# Patient Record
Sex: Male | Born: 1950 | Race: White | Hispanic: No | Marital: Married | Smoking: Never smoker
Health system: Southern US, Community
[De-identification: ages and names within clinical notes are randomized; demographics above are authoritative.]

## PROBLEM LIST (undated history)

## (undated) DIAGNOSIS — Z9119 Patient's noncompliance with other medical treatment and regimen: Secondary | ICD-10-CM

## (undated) DIAGNOSIS — G5620 Lesion of ulnar nerve, unspecified upper limb: Secondary | ICD-10-CM

## (undated) DIAGNOSIS — E669 Obesity, unspecified: Secondary | ICD-10-CM

## (undated) DIAGNOSIS — R7303 Prediabetes: Secondary | ICD-10-CM

## (undated) DIAGNOSIS — I482 Chronic atrial fibrillation, unspecified: Secondary | ICD-10-CM

## (undated) DIAGNOSIS — Z9049 Acquired absence of other specified parts of digestive tract: Secondary | ICD-10-CM

## (undated) DIAGNOSIS — I5022 Chronic systolic (congestive) heart failure: Secondary | ICD-10-CM

## (undated) DIAGNOSIS — I428 Other cardiomyopathies: Secondary | ICD-10-CM

## (undated) DIAGNOSIS — G4730 Sleep apnea, unspecified: Secondary | ICD-10-CM

## (undated) DIAGNOSIS — G4733 Obstructive sleep apnea (adult) (pediatric): Secondary | ICD-10-CM

## (undated) DIAGNOSIS — J449 Chronic obstructive pulmonary disease, unspecified: Secondary | ICD-10-CM

## (undated) DIAGNOSIS — I4819 Other persistent atrial fibrillation: Secondary | ICD-10-CM

## (undated) DIAGNOSIS — G5600 Carpal tunnel syndrome, unspecified upper limb: Secondary | ICD-10-CM

## (undated) DIAGNOSIS — R972 Elevated prostate specific antigen [PSA]: Secondary | ICD-10-CM

## (undated) DIAGNOSIS — I10 Essential (primary) hypertension: Secondary | ICD-10-CM

## (undated) DIAGNOSIS — I340 Nonrheumatic mitral (valve) insufficiency: Secondary | ICD-10-CM

## (undated) HISTORY — DX: Other persistent atrial fibrillation: I48.19

## (undated) HISTORY — DX: Patient's noncompliance with other medical treatment and regimen: Z91.19

## (undated) HISTORY — DX: Chronic systolic (congestive) heart failure: I50.22

## (undated) HISTORY — DX: Nonrheumatic mitral (valve) insufficiency: I34.0

## (undated) HISTORY — DX: Carpal tunnel syndrome, unspecified upper limb: G56.00

## (undated) HISTORY — DX: Obesity, unspecified: E66.9

## (undated) HISTORY — DX: Other cardiomyopathies: I42.8

## (undated) HISTORY — DX: Prediabetes: R73.03

## (undated) HISTORY — DX: Sleep apnea, unspecified: G47.30

## (undated) HISTORY — DX: Chronic atrial fibrillation, unspecified: I48.20

## (undated) HISTORY — DX: Elevated prostate specific antigen (PSA): R97.20

## (undated) HISTORY — DX: Lesion of ulnar nerve, unspecified upper limb: G56.20

## (undated) HISTORY — DX: Obstructive sleep apnea (adult) (pediatric): G47.33

## (undated) HISTORY — DX: Acquired absence of other specified parts of digestive tract: Z90.49

## (undated) HISTORY — DX: Essential (primary) hypertension: I10

## (undated) HISTORY — DX: Chronic obstructive pulmonary disease, unspecified: J44.9

---

## 2012-09-25 LAB — HM COLONOSCOPY

## 2012-10-30 DIAGNOSIS — G4730 Sleep apnea, unspecified: Secondary | ICD-10-CM | POA: Insufficient documentation

## 2012-10-30 DIAGNOSIS — K635 Polyp of colon: Secondary | ICD-10-CM | POA: Insufficient documentation

## 2013-02-15 DIAGNOSIS — Z9049 Acquired absence of other specified parts of digestive tract: Secondary | ICD-10-CM | POA: Insufficient documentation

## 2013-02-15 HISTORY — DX: Acquired absence of other specified parts of digestive tract: Z90.49

## 2013-02-15 HISTORY — PX: OTHER SURGICAL HISTORY: SHX169

## 2013-02-15 HISTORY — PX: APPENDECTOMY: SHX54

## 2013-10-29 DIAGNOSIS — R399 Unspecified symptoms and signs involving the genitourinary system: Secondary | ICD-10-CM | POA: Insufficient documentation

## 2013-11-18 DIAGNOSIS — K2970 Gastritis, unspecified, without bleeding: Secondary | ICD-10-CM | POA: Insufficient documentation

## 2014-01-13 DIAGNOSIS — J449 Chronic obstructive pulmonary disease, unspecified: Secondary | ICD-10-CM

## 2014-01-13 HISTORY — DX: Chronic obstructive pulmonary disease, unspecified: J44.9

## 2014-04-25 DIAGNOSIS — I10 Essential (primary) hypertension: Secondary | ICD-10-CM

## 2014-04-25 HISTORY — DX: Essential (primary) hypertension: I10

## 2014-05-10 DIAGNOSIS — G4733 Obstructive sleep apnea (adult) (pediatric): Secondary | ICD-10-CM

## 2014-05-10 HISTORY — DX: Obstructive sleep apnea (adult) (pediatric): G47.33

## 2014-05-10 LAB — PSA: PSA: 4.6

## 2015-02-15 DIAGNOSIS — E669 Obesity, unspecified: Secondary | ICD-10-CM | POA: Insufficient documentation

## 2015-02-15 HISTORY — DX: Obesity, unspecified: E66.9

## 2015-02-15 LAB — PSA: PSA: 4.9

## 2015-02-17 LAB — PSA: PSA: 4.9

## 2015-02-17 LAB — HEMOGLOBIN A1C: HEMOGLOBIN A1C: 5.7

## 2015-04-05 DIAGNOSIS — R972 Elevated prostate specific antigen [PSA]: Secondary | ICD-10-CM | POA: Insufficient documentation

## 2015-09-21 ENCOUNTER — Ambulatory Visit (INDEPENDENT_AMBULATORY_CARE_PROVIDER_SITE_OTHER): Payer: BLUE CROSS/BLUE SHIELD

## 2015-09-21 ENCOUNTER — Ambulatory Visit (INDEPENDENT_AMBULATORY_CARE_PROVIDER_SITE_OTHER): Payer: BLUE CROSS/BLUE SHIELD | Admitting: Family Medicine

## 2015-09-21 ENCOUNTER — Encounter: Payer: Self-pay | Admitting: Family Medicine

## 2015-09-21 VITALS — BP 135/75 | HR 80 | Ht 72.0 in | Wt 225.0 lb

## 2015-09-21 DIAGNOSIS — Z1159 Encounter for screening for other viral diseases: Secondary | ICD-10-CM

## 2015-09-21 DIAGNOSIS — Z114 Encounter for screening for human immunodeficiency virus [HIV]: Secondary | ICD-10-CM

## 2015-09-21 DIAGNOSIS — G5623 Lesion of ulnar nerve, bilateral upper limbs: Secondary | ICD-10-CM

## 2015-09-21 DIAGNOSIS — M25561 Pain in right knee: Secondary | ICD-10-CM | POA: Diagnosis not present

## 2015-09-21 DIAGNOSIS — R972 Elevated prostate specific antigen [PSA]: Secondary | ICD-10-CM

## 2015-09-21 DIAGNOSIS — M170 Bilateral primary osteoarthritis of knee: Secondary | ICD-10-CM | POA: Diagnosis not present

## 2015-09-21 DIAGNOSIS — G5603 Carpal tunnel syndrome, bilateral upper limbs: Secondary | ICD-10-CM

## 2015-09-21 DIAGNOSIS — E669 Obesity, unspecified: Secondary | ICD-10-CM

## 2015-09-21 DIAGNOSIS — G5600 Carpal tunnel syndrome, unspecified upper limb: Secondary | ICD-10-CM | POA: Insufficient documentation

## 2015-09-21 DIAGNOSIS — G5620 Lesion of ulnar nerve, unspecified upper limb: Secondary | ICD-10-CM | POA: Insufficient documentation

## 2015-09-21 HISTORY — DX: Elevated prostate specific antigen (PSA): R97.20

## 2015-09-21 MED ORDER — DICLOFENAC SODIUM 1 % TD GEL: 2.0000 g | Freq: Four times a day (QID) | TRANSDERMAL | 11 refills | Status: DC

## 2015-09-21 NOTE — Patient Instructions (Signed)
Thank you for coming in today. Get labs soon.  Call or go to the ER if you develop a large red swollen joint with extreme pain or oozing puss.   Arthritis Arthritis is a term that is commonly used to refer to joint pain or joint disease. There are more than 100 types of arthritis. CAUSES The most common cause of this condition is wear and tear of a joint. Other causes include:  Gout.  Inflammation of a joint.  An infection of a joint.  Sprains and other injuries near the joint.  A drug reaction or allergic reaction. In some cases, the cause may not be known. SYMPTOMS The main symptom of this condition is pain in the joint with movement. Other symptoms include:  Redness, swelling, or stiffness at a joint.  Warmth coming from the joint.  Fever.  Overall feeling of illness. DIAGNOSIS This condition may be diagnosed with a physical exam and tests, including:  Blood tests.  Urine tests.  Imaging tests, such as MRI, X-rays, or a CT scan. Sometimes, fluid is removed from a joint for testing. TREATMENT Treatment for this condition may involve:  Treatment of the cause, if it is known.  Rest.  Raising (elevating) the joint.  Applying cold or hot packs to the joint.  Medicines to improve symptoms and reduce inflammation.  Injections of a steroid such as cortisone into the joint to help reduce pain and inflammation. Depending on the cause of your arthritis, you may need to make lifestyle changes to reduce stress on your joint. These changes may include exercising more and losing weight. HOME CARE INSTRUCTIONS Medicines  Take over-the-counter and prescription medicines only as told by your health care provider.  Do not take aspirin to relieve pain if gout is suspected. Activities  Rest your joint if told by your health care provider. Rest is important when your disease is active and your joint feels painful, swollen, or stiff.  Avoid activities that make the pain  worse. It is important to balance activity with rest.  Exercise your joint regularly with range-of-motion exercises as told by your health care provider. Try doing low-impact exercise, such as:  Swimming.  Water aerobics.  Biking.  Walking. Joint Care  If your joint is swollen, keep it elevated if told by your health care provider.  If your joint feels stiff in the morning, try taking a warm shower.  If directed, apply heat to the joint. If you have diabetes, do not apply heat without permission from your health care provider.  Put a towel between the joint and the hot pack or heating pad.  Leave the heat on the area for 20-30 minutes.  If directed, apply ice to the joint:  Put ice in a plastic bag.  Place a towel between your skin and the bag.  Leave the ice on for 20 minutes, 2-3 times per day.  Keep all follow-up visits as told by your health care provider. This is important. SEEK MEDICAL CARE IF:  The pain gets worse.  You have a fever. SEEK IMMEDIATE MEDICAL CARE IF:  You develop severe joint pain, swelling, or redness.  Many joints become painful and swollen.  You develop severe back pain.  You develop severe weakness in your leg.  You cannot control your bladder or bowels.   This information is not intended to replace advice given to you by your health care provider. Make sure you discuss any questions you have with your health care provider.  Document Released: 03/21/2004 Document Revised: 11/02/2014 Document Reviewed: 05/09/2014 Elsevier Interactive Patient Education [employer].

## 2015-09-21 NOTE — Progress Notes (Signed)
Christian Ortiz is a 65 y.o. male who presents to [facility] today to establish care.  Patient complaints of ongoing anterior right knee pain for years.  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  Denies known injury.  He has tried ibuprofen and a sleeve which alleviates the pain.   Denies swelling, locking, catching, and weakness of his right leg.  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  Patient also endorses mild urinary hesitancy and incomplete emptying.  Denies dysuria and frequency.  He as a history of an elevated PSA to 4.9.  His symptoms have improved over the past year because he lost over 75 pounds.     AUA score 3/35 Quality of life score 1/6  Patient also has a history of pre-diabetes, hypertension, COPD, and OSA which have resolved since he lost weight.  He isn't taking any current medications.   Patient is followed by an orthopedist at [facility] for bilateral carpal tunnel and ulnar neuropathy.  He declined surgery.  His symptoms have improved since losing weight.   Health maintenance: Colon cancer screening up to date.    Past Medical History:  Diagnosis Date  . Carpal tunnel syndrome    Bilateral  . Ulnar neuropathy    No past surgical history on file. Social History  Substance Use Topics  . Smoking status: Never Smoker  . Smokeless tobacco: Never Used  . Alcohol use No   family history includes Cancer in his mother; Hypertension in his father.  ROS as above: No headache, visual changes, nausea, vomiting, diarrhea, constipation, dizziness, abdominal pain, skin rash, fevers, chills, night sweats, weight loss, swollen lymph nodes, body aches, joint swelling, muscle aches, chest pain, shortness of breath, mood changes, visual or auditory  hallucinations.   Medications: Current Outpatient Prescriptions  Medication Sig Dispense Refill  . diclofenac sodium (VOLTAREN) 1 % GEL Apply 2 g topically 4 (four) times daily. To affected joint. 100 g 11   No current facility-administered medications for this visit.    No Known Allergies   Exam:  BP 135/75   Pulse 80   Ht 6' (1.829 m)   Wt 225 lb (102.1 kg)   BMI 30.52 kg/m  Gen: Well NAD HEENT: EOMI,  MMM Lungs: Normal work of breathing. CTABL Heart: RRR no MRG Abd: NABS, Soft. Nondistended, Nontender Exts: Brisk capillary refill, warm and well perfused.  Right knee: No obvious deformity Full range of motion with mild crepitation in bilateral knees No bony tenderness. Strength, sensation to light touch, and reflexes intact  Procedure: Real-time Ultrasound Guided Injection of right knee  Device: GE Logiq E  Images permanently stored and available for review in the ultrasound unit. Verbal informed consent obtained. Discussed risks and benefits of procedure. Warned about infection bleeding damage to structures skin hypopigmentation and fat atrophy among others. Patient expresses understanding and agreement Time-out conducted.  Noted no overlying erythema, induration, or other signs of local infection.  Skin prepped in a sterile fashion.  Local anesthesia: Topical Ethyl chloride.  With sterile technique and under real time ultrasound guidance: 80mg  kenalog and 27ml marcaine injected easily.  Lot number for Marcaine: [id number] Lot number Kenalog [id number] Completed without difficulty  Pain immediately resolved suggesting accurate placement of the medication.  Advised to call if  fevers/chills, erythema, induration, drainage, or persistent bleeding.  Images permanently stored and available for review in the ultrasound unit.  Impression: Technically successful ultrasound guided injection.    No results found for this or any previous visit (from the past 24  hour(s)). No results found.  Knee Xray: Medial joint space narrowing of the right knee with osteophytes consistent with right knee osteoarthritis. Awaiting formal radiology review  Assessment and Plan: 65 y.o. male with:  1. Chronic right knee pain with evidence of osteophytes and joint space narrowing on Xray consistent with osteoarthritis   - Triamcinolone injection   - Voltaren gel  2. Elevated PSA: urinary symptoms mild and improving with weight loss.  Not taking any medications for BPH.   - Recheck PSA  3. Bilateral carpal tunnel syndrome and bilateral ulnar neuropathy   - Patient will send records from his EMG study   - may consider injections if symptoms worsen  4. Health maintenance:   - Hepatitis C and HIV screening   - CBC, CMP, TSH, A1c, lipid panel  Orders Placed This Encounter  Procedures  . DG Knee Complete 4 Views Right    Please include patellar sunrise, lateral, and weightbearing bilateral AP and bilateral rosenberg views    Order Specific Question:   Reason for exam:    Answer:   Please include patellar sunrise, lateral, and weightbearing bilateral AP and bilateral rosenberg views    Comments:   Please include patellar sunrise, lateral, and weightbearing bilateral AP and bilateral rosenberg views    Order Specific Question:   Preferred imaging location?    Answer:   [facility]  . DG Knee 1-2 Views Left    Order Specific Question:   Reason for Exam (SYMPTOM  OR DIAGNOSIS REQUIRED)    Answer:   For use with right knee x-ray, bilateral AP and Rosenberg standing.    Order Specific Question:   Preferred imaging location?    Answer:   [facility]  . CBC  . Comprehensive metabolic panel    Order Specific Question:   Has the patient fasted?    Answer:   No  . Hemoglobin A1c  . Lipid panel    Order Specific Question:   Has the patient fasted?    Answer:   No  . VITAMIN D 25 Hydroxy (Vit-D Deficiency, Fractures)  . TSH  . Hepatitis C  antibody  . HIV antibody  . PSA    Discussed warning signs or symptoms. Please see discharge instructions. Patient expresses understanding.

## 2015-09-26 ENCOUNTER — Encounter: Payer: Self-pay | Admitting: Family Medicine

## 2015-09-26 ENCOUNTER — Ambulatory Visit (INDEPENDENT_AMBULATORY_CARE_PROVIDER_SITE_OTHER): Payer: BLUE CROSS/BLUE SHIELD | Admitting: Family Medicine

## 2015-09-26 VITALS — BP 139/86 | HR 57 | Wt 223.0 lb

## 2015-09-26 DIAGNOSIS — J410 Simple chronic bronchitis: Secondary | ICD-10-CM | POA: Diagnosis not present

## 2015-09-26 DIAGNOSIS — Z1159 Encounter for screening for other viral diseases: Secondary | ICD-10-CM

## 2015-09-26 DIAGNOSIS — IMO0001 Reserved for inherently not codable concepts without codable children: Secondary | ICD-10-CM

## 2015-09-26 DIAGNOSIS — R972 Elevated prostate specific antigen [PSA]: Secondary | ICD-10-CM

## 2015-09-26 DIAGNOSIS — Z114 Encounter for screening for human immunodeficiency virus [HIV]: Secondary | ICD-10-CM

## 2015-09-26 DIAGNOSIS — K635 Polyp of colon: Secondary | ICD-10-CM

## 2015-09-26 DIAGNOSIS — Z0000 Encounter for general adult medical examination without abnormal findings: Secondary | ICD-10-CM | POA: Diagnosis not present

## 2015-09-26 LAB — LIPID PANEL
CHOL/HDL RATIO: 2.3 ratio (ref <=5.0)
CHOLESTEROL: 186 mg/dL (ref 125–200)
HDL: 80 mg/dL (ref >=40)
LDL Cholesterol: 96 mg/dL (ref <130)
TRIGLYCERIDES: 50 mg/dL (ref <150)
VLDL: 10 mg/dL (ref <30)

## 2015-09-26 LAB — COMPREHENSIVE METABOLIC PANEL
ALBUMIN: 4.3 g/dL (ref 3.6–5.1)
ALK PHOS: 66 U/L (ref 40–115)
ALT: 19 U/L (ref 9–46)
AST: 21 U/L (ref 10–35)
BUN: 16 mg/dL (ref 7–25)
CALCIUM: 9.6 mg/dL (ref 8.6–10.3)
CHLORIDE: 106 mmol/L (ref 98–110)
CO2: 23 mmol/L (ref 20–31)
Creat: 0.83 mg/dL (ref 0.70–1.25)
Glucose, Bld: 89 mg/dL (ref 65–99)
POTASSIUM: 4.5 mmol/L (ref 3.5–5.3)
Sodium: 141 mmol/L (ref 135–146)
TOTAL PROTEIN: 7.2 g/dL (ref 6.1–8.1)
Total Bilirubin: 0.6 mg/dL (ref 0.2–1.2)

## 2015-09-26 LAB — CBC
HEMATOCRIT: 43.4 % (ref 38.5–50.0)
HEMOGLOBIN: 14.4 g/dL (ref 13.2–17.1)
MCH: 30.9 pg (ref 27.0–33.0)
MCHC: 33.2 g/dL (ref 32.0–36.0)
MCV: 93.1 fL (ref 80.0–100.0)
MPV: 10.5 fL (ref 7.5–12.5)
Platelets: 277 10*3/uL (ref 140–400)
RBC: 4.66 MIL/uL (ref 4.20–5.80)
RDW: 14 % (ref 11.0–15.0)
WBC: 11 10*3/uL — ABNORMAL HIGH (ref 3.8–10.8)

## 2015-09-26 LAB — HEMOGLOBIN A1C
Hgb A1c MFr Bld: 5.8 % — ABNORMAL HIGH (ref <5.7)
MEAN PLASMA GLUCOSE: 120 mg/dL

## 2015-09-26 NOTE — Progress Notes (Signed)
       Ferrin Creach is a 65 y.o. male who presents to [facility] today for a visit. Patient was recently seen to establish care for problem focused visit. He would like to discuss wellness items today. He's doing well with no active complaints. He notes he recently has up-to-date colonoscopy and vaccines. He has not yet obtain fasting labs. He feels well with no fevers or chills nausea vomiting or diarrhea. He tries to exercise daily and reduces food intake.  We have not yet received medical records.   Past Medical History:  Diagnosis Date  . Carpal tunnel syndrome    Bilateral  . Ulnar neuropathy    Past Surgical History:  Procedure Laterality Date  . APPENDECTOMY     Social History  Substance Use Topics  . Smoking status: Never Smoker  . Smokeless tobacco: Never Used  . Alcohol use No   family history includes COPD in his father; Cancer in his mother; Diabetes in his father; Hypertension in his father.  ROS as above: No headache, visual changes, nausea, vomiting, diarrhea, constipation, dizziness, abdominal pain, skin rash, fevers, chills, night sweats, weight loss, swollen lymph nodes, body aches, joint swelling, muscle aches, chest pain, shortness of breath, mood changes, visual or auditory hallucinations.    Medications: Current Outpatient Prescriptions  Medication Sig Dispense Refill  . diclofenac sodium (VOLTAREN) 1 % GEL Apply 2 g topically 4 (four) times daily. To affected joint. 100 g 11   No current facility-administered medications for this visit.    No Known Allergies   Exam:  BP 139/86   Pulse (!) 57   Wt 223 lb (101.2 kg)   BMI 30.24 kg/m  Gen: Well NAD HEENT: EOMI,  MMM Lungs: Normal work of breathing. CTABL Heart: RRR no MRG Abd: NABS, Soft. Nondistended, Nontender Exts: Brisk capillary refill, warm and well perfused.   No  results found for this or any previous visit (from the past 24 hour(s)). No results found.    Assessment and Plan: 65 y.o. male with Well adult. Obtain basic fasting labs. Obtain medical records to check on health maintenance items. Based on patient report they'll seem to be up-to-date. Continue activities and fitness.   Orders Placed This Encounter  Procedures  . CBC  . Comprehensive metabolic panel    Order Specific Question:   Has the patient fasted?    Answer:   No  . Lipid panel    Order Specific Question:   Has the patient fasted?    Answer:   No  . Hemoglobin A1c  . VITAMIN D 25 Hydroxy (Vit-D Deficiency, Fractures)  . PSA  . HIV antibody  . Hepatitis C antibody    Discussed warning signs or symptoms. Please see discharge instructions. Patient expresses understanding.

## 2015-09-26 NOTE — Patient Instructions (Signed)
Thank you for coming in today. Get fasting labs.  Return as needed.  Call or go to the emergency room if you get worse, have trouble breathing, have chest pains, or palpitations.

## 2015-09-27 ENCOUNTER — Encounter: Payer: Self-pay | Admitting: Family Medicine

## 2015-09-27 DIAGNOSIS — R7303 Prediabetes: Secondary | ICD-10-CM

## 2015-09-27 HISTORY — DX: Prediabetes: R73.03

## 2015-09-27 LAB — PSA: PSA: 4.42 ng/mL — ABNORMAL HIGH (ref <=4.00)

## 2015-09-27 LAB — HEPATITIS C ANTIBODY: HCV AB: NEGATIVE

## 2015-09-27 LAB — HIV ANTIBODY (ROUTINE TESTING W REFLEX): HIV: NONREACTIVE

## 2015-09-27 LAB — VITAMIN D 25 HYDROXY (VIT D DEFICIENCY, FRACTURES): VIT D 25 HYDROXY: 37 ng/mL (ref 30–100)

## 2015-10-18 ENCOUNTER — Encounter: Payer: Self-pay | Admitting: Family Medicine

## 2015-10-19 ENCOUNTER — Encounter: Payer: Self-pay | Admitting: Family Medicine

## 2016-01-29 ENCOUNTER — Ambulatory Visit (INDEPENDENT_AMBULATORY_CARE_PROVIDER_SITE_OTHER): Payer: BLUE CROSS/BLUE SHIELD | Admitting: Family Medicine

## 2016-01-29 ENCOUNTER — Encounter: Payer: Self-pay | Admitting: Family Medicine

## 2016-01-29 ENCOUNTER — Other Ambulatory Visit: Payer: Self-pay | Admitting: Family Medicine

## 2016-01-29 ENCOUNTER — Ambulatory Visit (INDEPENDENT_AMBULATORY_CARE_PROVIDER_SITE_OTHER): Payer: Worker's Compensation

## 2016-01-29 VITALS — BP 142/75 | HR 62 | Wt 230.0 lb

## 2016-01-29 DIAGNOSIS — M25561 Pain in right knee: Secondary | ICD-10-CM

## 2016-01-29 DIAGNOSIS — Z23 Encounter for immunization: Secondary | ICD-10-CM

## 2016-01-29 DIAGNOSIS — M170 Bilateral primary osteoarthritis of knee: Secondary | ICD-10-CM | POA: Diagnosis not present

## 2016-01-29 NOTE — Patient Instructions (Signed)
Thank you for coming in today. Take it easy.  Use the gel and take ibuprofen or aleve for pain as needed.  Return if worse.

## 2016-01-29 NOTE — Progress Notes (Signed)
   Christian Ortiz is a 65 y.o. male who presents to [facility] today for Knee pain.  Patient was in his normal state of health today. He was at work today when he stepped on a package and twisted his knee. He fell to the floor. He notes pain in a small amount of swelling. He has a history of pre-existing DJD to the right knee however he was essentially pain-free prior to this injury today. He denies significant locking or catching. He denies any fevers or chills. He has not tried treatment.    Past Medical History:  Diagnosis Date  . Carpal tunnel syndrome    Bilateral  . Ulnar neuropathy    Past Surgical History:  Procedure Laterality Date  . APPENDECTOMY     Social History  Substance Use Topics  . Smoking status: Never Smoker  . Smokeless tobacco: Never Used  . Alcohol use No     ROS:  As above   Medications: Current Outpatient Prescriptions  Medication Sig Dispense Refill  . diclofenac sodium (VOLTAREN) 1 % GEL Apply 2 g topically 4 (four) times daily. To affected joint. 100 g 11   No current facility-administered medications for this visit.    No Known Allergies   Exam:  BP (!) 142/75   Pulse 62   Wt 230 lb (104.3 kg)   BMI 31.19 kg/m  General: Well Developed, well nourished, and in no acute distress.  Neuro/Psych: Alert and oriented x3, extra-ocular muscles intact, able to move all 4 extremities, sensation grossly intact. Skin: Warm and dry, no rashes noted.  Respiratory: Not using accessory muscles, speaking in full sentences, trachea midline.  Cardiovascular: Pulses palpable, no extremity edema. Abdomen: Does not appear distended. MSK: Right knee no significant effusion. Range of motion 0-120. Tender palpation medial joint line. Negative anterior posterior drawer testing. Mildly positive medial McMurray's test. Negative laxity or significant pain with valgus or varus stress. Antalgic gait present.  X-ray  right knee shows significant medial compartment DJD. No acute fractures visible. Awaiting formal radiology review     No results found for this or any previous visit (from the past 48 hour(s)). No results found.    Assessment and Plan: 65 y.o. male with knee pain. Injury at work. Patient likely has exacerbation of existing DJD. Plan for diclofenac gel and relative rest. If not improving proceed with injection versus MRI. Return as needed. Work note provided.  Of note patient is not yet filed for [employer]   Orders Placed This Encounter  Procedures  . DG Knee Complete 4 Views Right    Please include patellar sunrise, lateral, and weightbearing bilateral AP and bilateral rosenberg views    Standing Status:   Future    Number of Occurrences:   1    Standing Expiration Date:   03/30/2017    Order Specific Question:   Reason for exam:    Answer:   Please include patellar sunrise, lateral, and weightbearing bilateral AP and bilateral rosenberg views    Comments:   Please include patellar sunrise, lateral, and weightbearing bilateral AP and bilateral rosenberg views    Order Specific Question:   Preferred imaging location?    Answer:   [facility]    Discussed warning signs or symptoms. Please see discharge instructions. Patient expresses understanding.

## 2016-03-06 ENCOUNTER — Ambulatory Visit: Payer: Self-pay | Admitting: Family Medicine

## 2016-03-07 ENCOUNTER — Ambulatory Visit (INDEPENDENT_AMBULATORY_CARE_PROVIDER_SITE_OTHER): Payer: Worker's Compensation | Admitting: Family Medicine

## 2016-03-07 ENCOUNTER — Encounter: Payer: Self-pay | Admitting: Family Medicine

## 2016-03-07 VITALS — BP 139/75 | HR 78 | Wt 228.0 lb

## 2016-03-07 DIAGNOSIS — M25561 Pain in right knee: Secondary | ICD-10-CM | POA: Diagnosis not present

## 2016-03-07 NOTE — Progress Notes (Signed)
Christian Ortiz is a 66 y.o. male who presents to [facility] today for follow up right knee pain.   He was seen 1 month ago for worsening of right knee pain after work injury. Since then, right anterior knee pain has improved with bracing, tylenol, and voltaren gel. For the past couple of weeks, he has noted right posterior knee pain that is elicited by going up and down stairs. It also hurts after sitting with his knee bent for a long time and is relieved by movement. No mechanical catching, fevers, or joint swelling. No recent trauma to the area. He received a steroid shot in his knee 6 months ago.    Past Medical History:  Diagnosis Date  . Carpal tunnel syndrome    Bilateral  . Ulnar neuropathy    Past Surgical History:  Procedure Laterality Date  . APPENDECTOMY     Social History  Substance Use Topics  . Smoking status: Never Smoker  . Smokeless tobacco: Never Used  . Alcohol use No     ROS:  As above   Medications: Current Outpatient Prescriptions  Medication Sig Dispense Refill  . diclofenac sodium (VOLTAREN) 1 % GEL Apply 2 g topically 4 (four) times daily. To affected joint. 100 g 11   No current facility-administered medications for this visit.    No Known Allergies   Exam:  BP 139/75   Pulse 78   Wt 228 lb (103.4 kg)   BMI 30.92 kg/m  General: Well Developed, well nourished, and in no acute distress.  Neuro/Psych: Alert and oriented x3, extra-ocular muscles intact, able to move all 4 extremities, sensation grossly intact. Skin: Warm and dry, no rashes noted.  Respiratory: Not using accessory muscles, speaking in full sentences, trachea midline.  Cardiovascular: Pulses palpable, no extremity edema. Abdomen: Does not appear distended. MSK: Right knee normal appearing. No tenderness to palpation anteriorly. Tender to palpation posteriorly; no posterior masses. No pain with resisted flexion or extension. Varus  gait.   Right knee x-ray 01/29/16: IMPRESSION: Tricompartment degenerative changes without fracture or dislocation noted.  Procedure: Real-time Ultrasound Guided Injection of right knee  Device: GE Logiq E  Images permanently stored and available for review in the ultrasound unit. Verbal informed consent obtained. Discussed risks and benefits of procedure. Warned about infection bleeding damage to structures skin hypopigmentation and fat atrophy among others. Patient expresses understanding and agreement Time-out conducted.  Noted no overlying erythema, induration, or other signs of local infection.  Skin prepped in a sterile fashion.  Local anesthesia: Topical Ethyl chloride.  With sterile technique and under real time ultrasound guidance: 80mg  kenalog and 41ml marcaine injected easily.  Completed without difficulty  Pain immediately resolved suggesting accurate placement of the medication.  Advised to call if fevers/chills, erythema, induration, drainage, or persistent bleeding.  Images permanently stored and available for review in the ultrasound unit.  Impression: Technically successful ultrasound guided injection.    No results found for this or any previous visit (from the past 48 hour(s)). No results found.  Assessment and Plan: 66 y.o. male with right knee pain exacerbated by work injury 1 month ago. His pain has changed location but is still consistent with DJD. Pain persists despite diclofenac gel. - Steroid injection today - Physical therapy, exercise bike - F/u as needed; consider hyaluronic acid injection if not improved    No orders of the defined types were placed in this encounter.   Discussed warning signs or  symptoms. Please see discharge instructions. Patient expresses understanding.

## 2016-03-07 NOTE — Patient Instructions (Signed)
Thank you for coming in today. Call or go to the ER if you develop a large red swollen joint with extreme pain or oozing puss.  Return as needed.  Let me know if you are not better and we will try the gel shot.

## 2016-03-12 ENCOUNTER — Ambulatory Visit (INDEPENDENT_AMBULATORY_CARE_PROVIDER_SITE_OTHER): Payer: BLUE CROSS/BLUE SHIELD | Admitting: Physical Therapy

## 2016-03-12 ENCOUNTER — Encounter: Payer: Self-pay | Admitting: Physical Therapy

## 2016-03-12 DIAGNOSIS — M25661 Stiffness of right knee, not elsewhere classified: Secondary | ICD-10-CM | POA: Diagnosis not present

## 2016-03-12 DIAGNOSIS — M25561 Pain in right knee: Secondary | ICD-10-CM

## 2016-03-12 DIAGNOSIS — M6281 Muscle weakness (generalized): Secondary | ICD-10-CM

## 2016-03-12 NOTE — Therapy (Signed)
[place], [place], [address] Phone: [phone]   Fax:  [phone]  Physical Therapy Evaluation  Patient Details  Name: Christian Ortiz MRN: [id number] Date of Birth: December 11, 1950 Referring Provider: Dr Georgina Snell  Encounter Date: 03/12/2016      PT End of Session - 03/12/16 1105    Visit Number 1   Number of Visits 8   Date for PT Re-Evaluation 04/09/16   PT Start Time 1105   PT Stop Time 1147   PT Time Calculation (min) 42 min      Past Medical History:  Diagnosis Date  . Carpal tunnel syndrome    Bilateral  . Ulnar neuropathy     Past Surgical History:  Procedure Laterality Date  . APPENDECTOMY      There were no vitals filed for this visit.       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 No visible edema, immediate pain.  Saw MD after work. Was told to use the spin bike at home. . Had an injection and it has helped   How long can you sit comfortably? no limitations   How long can you walk comfortably? moves a lot and is doing ok.    Diagnostic tests x-rays show arthritis   Patient Stated Goals make the legs stronger.    Currently in Pain? No/denies            [facility] PT Assessment - 03/12/16 0001      Assessment   Medical Diagnosis Rt knee pain   Referring Provider Dr Georgina Snell   Onset Date/Surgical Date 01/30/16   Hand Dominance --  both   Next MD Visit 04/18/16   Prior Therapy none     Precautions   Precautions None   Required Braces or Orthoses --  wears knee support when at work, with brace on the side.      Balance Screen   Has the patient fallen in the past 6 months Yes   How many times? 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      Posture/Postural Control   Posture/Postural Control Postural limitations   Postural Limitations --  Rt knee varus      ROM / Strength   AROM / PROM / Strength AROM;Strength     AROM   AROM Assessment Site Knee   Right/Left Knee Left;Right   Right Knee Extension -2   Right Knee Flexion 132   Left Knee Extension 0   Left Knee Flexion 135     Strength   Strength Assessment Site Hip;Knee;Ankle   Right/Left Hip Right;Left   Right Hip Flexion --  5-/5  Right Hip Extension 4+/5   Right Hip ABduction 4/5   Left Hip Flexion 5/5   Left Hip Extension 4+/5   Left Hip ABduction 4+/5     Flexibility   Soft Tissue Assessment /Muscle Length yes   Hamstrings supine SLR Rt 90, Lt 96   Quadriceps prone knee flex Rt 109, Lt 127     Palpation   Patella mobility crepitis with movement   Palpation comment no point tenderness                   [facility] Adult PT Treatment/Exercise - 03/12/16 0001      Exercises   Exercises Knee/Hip     Knee/Hip Exercises: Stretches   Quad Stretch Right;2 reps;60 seconds  prone with strap     Knee/Hip Exercises: Standing   SLS 10 reps FWD leans Rt     Knee/Hip Exercises: Supine   Quad Sets Right;Strengthening;5 sets  10 sec holds   Straight Leg Raises Strengthening;Right;10 reps  in long sit     Knee/Hip Exercises: Sidelying   Hip ABduction Strengthening;Right;10 reps                     PT Long Term Goals - 03/12/16 1247      PT LONG TERM GOAL #1   Title I with advanced HEP (  04/09/16)    Time 4   Period Weeks   Status New     PT LONG TERM GOAL #2   Title increase Rt knee quad flexibility =/> 120 degrees in prone ( 04/09/16)_    Time 4   Period Weeks   Status New     PT LONG TERM GOAL #3   Title improve FOTO =/< 33% limited ( 04/09/16)    Time 4   Period Weeks   Status New     PT LONG TERM GOAL #4   Title demo Rt hip strength =/> 5-/5 ( 04/09/16)    Time 4   Period Weeks   Status New     PT LONG TERM GOAL #5   Title report =/> 75% improvement in Rt knee pain when transitioning sit to stand after prolonged sitting ( 04/09/16)    Time 4   Period Weeks   Status New               Plan - 03/12/16 1252    Clinical Impression Statement 66 yo male presents with Rt knee pain after a fall, xrays show arthritis in the joint. He has stiffness and pain in the knee, some better after the injection.  He has weakness in his hips and postural changes - varus in his Rt LE    Rehab Potential Excellent   PT Frequency 2x / week   PT Duration 4 weeks   PT Treatment/Interventions Moist Heat;Ultrasound;Therapeutic exercise;Dry needling;Taping;Manual techniques;Vasopneumatic Device;Neuromuscular re-education;Cryotherapy;Electrical Stimulation;Patient/family education   PT Next Visit Plan progress LE strengthening, proprioception and modalities PRN   Consulted and Agree with Plan of Care Patient      Patient will benefit from skilled therapeutic intervention in order to improve the following deficits and impairments:  Pain, Decreased range of motion, Decreased strength  Visit Diagnosis: Acute pain of right knee - Plan: PT plan of care cert/re-cert  Muscle weakness (generalized) - Plan: PT plan of care cert/re-cert  Stiffness of right knee, not elsewhere classified - Plan: PT plan of care cert/re-cert     Problem List Patient Active Problem List  Diagnosis Date Noted  . Prediabetes 09/27/2015  . Right knee pain 09/21/2015  . Carpal tunnel syndrome  09/21/2015  . Ulnar neuropathy 09/21/2015  . Elevated PSA 09/21/2015  . Obesity (BMI 30.0-34.9) 02/15/2015  . Chronic obstructive pulmonary disease ([place]) 01/13/2014  . Gastritis 11/18/2013  . Lower urinary tract symptoms (LUTS) 10/29/2013  . S/P laparoscopic appendectomy 02/15/2013  . Sessile colonic polyp 02/15/2013    Jeral Pinch PT  03/12/2016, 12:57 PM  [facility] [place], [place], [address] Phone: [phone]   Fax:  [phone]  Name: Mona Thommen MRN: [id number] Date of Birth: Sep 09, 1950

## 2016-03-12 NOTE — Patient Instructions (Addendum)
Strengthening: Straight Leg Raise (Phase 3)    Resting on hands, tighten muscles on front of right thigh, then lift leg __6-10__ inches from surface, keeping knee locked. Repeat _10__ times per set. Do _2-3_ sets per session. Do __1__ sessions per day.  Antiemboli: Isometric    Pull toes toward left knee, tense muscles on front of thigh and press knee down. Keep leg and buttock flat on floor. Hold _10___ seconds. Repeat __10__ times per set. Do ___1_ sets per session. Do _1___ sessions per day.  Strengthening: Hip Abduction (Side-Lying) - keep hip perpendicular to floor and leg slightly behind.     Tighten muscles on front of right thigh, then lift leg __8-10__ inches from surface, keeping knee locked.  Repeat __10__ times per set. Do _2-3___ sets per session. Do __1__ sessions per day.  Quads / HF, Prone - use a belt or sheet/towel, have arms do the work and leg relaxes into the stretch.     Lie face down, knees together. Grasp one ankle with same-side hand. Use towel if needed to reach. Gently pull foot toward buttock. Hold _45-60__ seconds. Repeat _1__ times per session. Do __2_ sessions per day. Repeat on the other side.  Balance: Unilateral - [employer] - do next to a counter or sturdy table for safety.    Stand on rightt foot, hands on hips. Keeping hips level, bend forward as if to touch forehead to wall. Hold __1__ seconds. Relax. Repeat __10__ times per set. Do __2-3__ sets per session. Do __1__ sessions per day.  Copyright  VHI. All rights reserved.

## 2016-03-15 ENCOUNTER — Ambulatory Visit (INDEPENDENT_AMBULATORY_CARE_PROVIDER_SITE_OTHER): Payer: BLUE CROSS/BLUE SHIELD | Admitting: Physical Therapy

## 2016-03-15 DIAGNOSIS — M25561 Pain in right knee: Secondary | ICD-10-CM

## 2016-03-15 DIAGNOSIS — M25661 Stiffness of right knee, not elsewhere classified: Secondary | ICD-10-CM | POA: Diagnosis not present

## 2016-03-15 DIAGNOSIS — M6281 Muscle weakness (generalized): Secondary | ICD-10-CM | POA: Diagnosis not present

## 2016-03-15 NOTE — Therapy (Signed)
[place] [facility] [place], [place], [address] Phone: [phone]   Fax:  [phone]  Physical Therapy Treatment  Patient Details  Name: Christian Ortiz MRN: [id number] Date of Birth: 1950/05/10 Referring Provider: Dr. Georgina Snell   Encounter Date: 03/15/2016      PT End of Session - 03/15/16 1404    Visit Number 2   Number of Visits 8   Date for PT Re-Evaluation 04/09/16   PT Start Time 1332   PT Stop Time 1405   PT Time Calculation (min) 33 min   Activity Tolerance Patient tolerated treatment well      Past Medical History:  Diagnosis Date  . Carpal tunnel syndrome    Bilateral  . Ulnar neuropathy     Past Surgical History:  Procedure Laterality Date  . APPENDECTOMY      There were no vitals filed for this visit.      Subjective Assessment - 03/15/16 1338    Subjective Pt reports he is sore today.  He has been doing exercises 1x/day. The pain relief he had from injection has worn off.  He wears braces and uses medicated cream on knees.    Currently in Pain? Yes   Pain Score 5    Pain Location Knee   Pain Orientation Right   Aggravating Factors  stairs    Pain Relieving Factors cream, braces            [facility] PT Assessment - 03/15/16 0001      Assessment   Medical Diagnosis Rt knee pain   Referring Provider Dr. Georgina Snell    Onset Date/Surgical Date 01/30/16   Next MD Visit 04/18/16   Prior Therapy none     Flexibility   Quadriceps prone knee flex Rt 110          [facility] Adult PT Treatment/Exercise - 03/15/16 0001      Knee/Hip Exercises: Stretches   Passive Hamstring Stretch Right;2 reps;30 seconds   Quad Stretch Right;3 reps;30 seconds  prone with strap     Knee/Hip Exercises: Aerobic   Nustep L5: 5.5 min      Knee/Hip Exercises: Supine   Quad Sets Right;Strengthening;5 reps  10 sec holds   Straight Leg Raises Strengthening;Right;10 reps  changed to supine, longsit too  difficult   Straight Leg Raise with External Rotation Right;1 set;10 reps   Straight Leg Raise with External Rotation Limitations challenging      Knee/Hip Exercises: Sidelying   Hip ABduction Strengthening;Right;10 reps  2 sets, tactile cues to correct form                PT Education - 03/15/16 1452    Education provided Yes   Education Details HEP- altered SLR to supine. Encouraged pt to use TENS at home (with ice/ heat) for pain relief.     Person(s) Educated Patient   Methods Explanation   Comprehension Verbalized understanding             PT Long Term Goals - 03/15/16 1507      PT LONG TERM GOAL #1   Title I with advanced HEP ( 04/09/16)    Time 4   Period Weeks   Status On-going     PT LONG TERM GOAL #2   Title increase Rt knee quad flexibility =/> 120 degrees in prone ( 04/09/16)_    Time 4   Period Weeks   Status On-going     PT LONG TERM  GOAL #3   Title improve FOTO =/< 33% limited ( 04/09/16)    Time 4   Period Weeks   Status On-going     PT LONG TERM GOAL #4   Title demo Rt hip strength =/> 5-/5 ( 04/09/16)    Time 4   Period Weeks   Status On-going     PT LONG TERM GOAL #5   Title report =/> 75% improvement in Rt knee pain when transitioning sit to stand after prolonged sitting ( 04/09/16)    Time 4   Period Weeks   Status On-going               Plan - 03/15/16 1453    Clinical Impression Statement Pt's Rt knee pain has returned since receiving injection.  His quad flexibility is similar to 3 days ago.  He demonstrated weakness in Rt quad; had difficulty sustaining quad set and straight knee with SLR.   Modified HEP from long sitting SLR to supine until strength improves.  Pt declined modalities; will do at home.    Rehab Potential Excellent   PT Frequency 2x / week   PT Duration 4 weeks   PT Treatment/Interventions Moist Heat;Ultrasound;Therapeutic exercise;Dry needling;Taping;Manual techniques;Vasopneumatic Device;Neuromuscular  re-education;Cryotherapy;Electrical Stimulation;Patient/family education   PT Next Visit Plan progress LE strengthening, proprioception and modalities PRN   Consulted and Agree with Plan of Care Patient      Patient will benefit from skilled therapeutic intervention in order to improve the following deficits and impairments:  Pain, Decreased range of motion, Decreased strength  Visit Diagnosis: Acute pain of right knee  Muscle weakness (generalized)  Stiffness of right knee, not elsewhere classified     Problem List Patient Active Problem List   Diagnosis Date Noted  . Prediabetes 09/27/2015  . Right knee pain 09/21/2015  . Carpal tunnel syndrome 09/21/2015  . Ulnar neuropathy 09/21/2015  . Elevated PSA 09/21/2015  . Obesity (BMI 30.0-34.9) 02/15/2015  . Chronic obstructive pulmonary disease ([place]) 01/13/2014  . Gastritis 11/18/2013  . Lower urinary tract symptoms (LUTS) 10/29/2013  . S/P laparoscopic appendectomy 02/15/2013  . Sessile colonic polyp 02/15/2013   Kerin Perna, PTA 03/15/16 3:08 PM  [place], [place], [address] Phone: [phone]   Fax:  [phone]  Name: Christian Ortiz MRN: [id number] Date of Birth: 03-05-1950

## 2016-03-19 ENCOUNTER — Ambulatory Visit (INDEPENDENT_AMBULATORY_CARE_PROVIDER_SITE_OTHER): Payer: BLUE CROSS/BLUE SHIELD | Admitting: Physical Therapy

## 2016-03-19 ENCOUNTER — Encounter: Payer: Self-pay | Admitting: Physical Therapy

## 2016-03-19 DIAGNOSIS — M6281 Muscle weakness (generalized): Secondary | ICD-10-CM | POA: Diagnosis not present

## 2016-03-19 DIAGNOSIS — M25661 Stiffness of right knee, not elsewhere classified: Secondary | ICD-10-CM

## 2016-03-19 DIAGNOSIS — M25561 Pain in right knee: Secondary | ICD-10-CM | POA: Diagnosis not present

## 2016-03-19 NOTE — Therapy (Signed)
[place], [place], [address] Phone: [phone]   Fax:  [phone]  Physical Therapy Treatment  Patient Details  Name: Christian Ortiz MRN: [id number] Date of Birth: [birth year] Referring Provider: Dr. Georgina Snell   Encounter Date: 03/19/2016      PT End of Session - 03/19/16 1017    Visit Number 3   Number of Visits 8   Date for PT Re-Evaluation 04/09/16   PT Start Time 1017   PT Stop Time 1100   PT Time Calculation (min) 43 min   Activity Tolerance Patient tolerated treatment well      Past Medical History:  Diagnosis Date  . Carpal tunnel syndrome    Bilateral  . Ulnar neuropathy     Past Surgical History:  Procedure Laterality Date  . APPENDECTOMY      There were no vitals filed for this visit.      Subjective Assessment - 03/19/16 1019    Subjective Pt had to go to work at [employer] this morning so he is very tired. Work has been very busy playing catch up after all the snow.  The knee is getting better   Patient Stated Goals make the legs stronger.    Currently in Pain? Yes   Pain Score 3    Pain Location Knee   Pain Orientation Right   Pain Descriptors / Indicators Aching   Pain Type Acute pain   Pain Onset More than a month ago   Pain Frequency Intermittent   Aggravating Factors  stairs, is avoiding squating   Pain Relieving Factors cream and braces            [facility] PT Assessment - 03/19/16 0001      Assessment   Medical Diagnosis Rt knee pain     Flexibility   Quadriceps prone Rt knee flex 114                     [facility] Adult PT Treatment/Exercise - 03/19/16 0001      Knee/Hip Exercises: Stretches   Sports administrator Right;2 reps;30 seconds  prone with strap   Piriformis Stretch Right;20 seconds;2 reps   Other Knee/Hip Stretches ITB stretch with band, cross body     Knee/Hip Exercises: Aerobic   Recumbent Bike --   Nustep L5x6'     Knee/Hip  Exercises: Standing   Lateral Step Up Right;3 sets;10 reps;Step Height: 6"  VC for form, keeping TA contracted   Forward Step Up Right;3 sets;10 reps;Step Height: 6"   Step Down Right;3 sets;10 reps;Step Height: 4"   Lunge Walking - Round Trips 20 reps lunge with Rt LE and Lt LE hip flexion, HHA at stairs.    Other Standing Knee Exercises XTS with pink sport cord x 10 in all directions     Knee/Hip Exercises: Sidelying   Other Sidelying Knee/Hip Exercises 10 reps each Rt LE FWD/BWD kicks, CW/CCW circles, hip arcs                     PT Long Term Goals - 03/19/16 1055      PT LONG TERM GOAL #1   Title I with advanced HEP ( 04/09/16)      PT LONG TERM GOAL #2   Title increase Rt knee quad flexibility =/> 120 degrees in prone ( 04/09/16)_    Status On-going     PT LONG TERM GOAL #3   Title improve FOTO =/<  33% limited ( 04/09/16)    Status On-going     PT LONG TERM GOAL #4   Title demo Rt hip strength =/> 5-/5 ( 04/09/16)    Status On-going     PT LONG TERM GOAL #5   Title report =/> 75% improvement in Rt knee pain when transitioning sit to stand after prolonged sitting ( 04/09/16)    Status On-going               Plan - 03/19/16 1100    Clinical Impression Statement Gene is showing improvement in quad flexibility and pain reduction.  He still has weakness in the Rt LE and impaired proprioception.  Progressing to his goals.    Rehab Potential Excellent   PT Frequency 2x / week   PT Duration 4 weeks   PT Treatment/Interventions Moist Heat;Ultrasound;Therapeutic exercise;Dry needling;Taping;Manual techniques;Vasopneumatic Device;Neuromuscular re-education;Cryotherapy;Electrical Stimulation;Patient/family education   PT Next Visit Plan cont strengtheing and proprioception and flexibility      Patient will benefit from skilled therapeutic intervention in order to improve the following deficits and impairments:  Pain, Decreased range of motion, Decreased  strength  Visit Diagnosis: Acute pain of right knee  Muscle weakness (generalized)  Stiffness of right knee, not elsewhere classified     Problem List Patient Active Problem List   Diagnosis Date Noted  . Prediabetes 09/27/2015  . Right knee pain 09/21/2015  . Carpal tunnel syndrome 09/21/2015  . Ulnar neuropathy 09/21/2015  . Elevated PSA 09/21/2015  . Obesity (BMI 30.0-34.9) 02/15/2015  . Chronic obstructive pulmonary disease ([place]) 01/13/2014  . Gastritis 11/18/2013  . Lower urinary tract symptoms (LUTS) 10/29/2013  . S/P laparoscopic appendectomy 02/15/2013  . Sessile colonic polyp 02/15/2013    Jeral Pinch PT  03/19/2016, 11:02 AM  [facility] [place], [place], [address] Phone: [phone]   Fax:  [phone]  Name: Yavin Lukowski MRN: [id number] Date of Birth: 03/31/1950

## 2016-03-22 ENCOUNTER — Ambulatory Visit (INDEPENDENT_AMBULATORY_CARE_PROVIDER_SITE_OTHER): Payer: BLUE CROSS/BLUE SHIELD | Admitting: Physical Therapy

## 2016-03-22 DIAGNOSIS — M25561 Pain in right knee: Secondary | ICD-10-CM

## 2016-03-22 DIAGNOSIS — M6281 Muscle weakness (generalized): Secondary | ICD-10-CM | POA: Diagnosis not present

## 2016-03-22 DIAGNOSIS — M25661 Stiffness of right knee, not elsewhere classified: Secondary | ICD-10-CM | POA: Diagnosis not present

## 2016-03-22 NOTE — Therapy (Addendum)
[place], [place], [address] Phone: [phone]   Fax:  [phone]  Physical Therapy Treatment  Patient Details  Name: Vasilis Luhman MRN: [id number] Date of Birth: [birth year] Referring Provider: Dr. Georgina Snell   Encounter Date: 03/22/2016      PT End of Session - 03/22/16 1105    Visit Number 4   Number of Visits 8   Date for PT Re-Evaluation 04/09/16   PT Start Time 1059   PT Stop Time 1138   PT Time Calculation (min) 39 min      Past Medical History:  Diagnosis Date  . Carpal tunnel syndrome    Bilateral  . Ulnar neuropathy     Past Surgical History:  Procedure Laterality Date  . APPENDECTOMY      There were no vitals filed for this visit.      Subjective Assessment - 03/22/16 1106    Subjective "It took me 2 days to recover from the last workout".  Pt reports he's doing pretty good today, "knock on wood".    Currently in Pain? No/denies   Pain Score 0-No pain            [facility] PT Assessment - 03/22/16 0001      Assessment   Medical Diagnosis Rt knee pain     Flexibility   Quadriceps prone Rt knee flex 117           [facility] Adult PT Treatment/Exercise - 03/22/16 0001      Knee/Hip Exercises: Stretches   Passive Hamstring Stretch Right;2 reps   Quad Stretch Right;30 seconds;3 reps  prone with strap   Piriformis Stretch Right;Left;2 reps;30 seconds   Piriformis Stretch Limitations began cramping in Rt adductors - rested, then performed adductor stretch.    Other Knee/Hip Stretches ITB stretch with band, cross body   Other Knee/Hip Stretches butterfly adductor stretch x 1 min x 2 reps      Knee/Hip Exercises: Aerobic   Nustep L5x6'     Knee/Hip Exercises: Standing   Step Down Left;1 set;15 reps;Hand Hold: 2;Step Height: 6"   Other Standing Knee Exercises XTS with pink sport cord x 5reps in all directions (VC for even steps)     Knee/Hip Exercises: Sidelying    Other Sidelying Knee/Hip Exercises 10 reps each Rt LE FWD/BWD kicks, CW/CCW circles to front (fatigued quickly) VC for form.            PT Long Term Goals - 03/19/16 1055      PT LONG TERM GOAL #1   Title I with advanced HEP ( 04/09/16)      PT LONG TERM GOAL #2   Title increase Rt knee quad flexibility =/> 120 degrees in prone ( 04/09/16)_    Status On-going     PT LONG TERM GOAL #3   Title improve FOTO =/< 33% limited ( 04/09/16)    Status On-going     PT LONG TERM GOAL #4   Title demo Rt hip strength =/> 5-/5 ( 04/09/16)    Status On-going     PT LONG TERM GOAL #5   Title report =/> 75% improvement in Rt knee pain when transitioning sit to stand after prolonged sitting ( 04/09/16)    Status On-going               Plan - 03/22/16 1128    Clinical Impression Statement Pt tolerated all exercises well, without increase in symptoms.  He  did have some cramping in RLE with quad stretch, reduced with use of towel roll above knee in prone.    Rehab Potential Excellent   PT Frequency 2x / week   PT Duration 4 weeks   PT Treatment/Interventions Moist Heat;Ultrasound;Therapeutic exercise;Dry needling;Taping;Manual techniques;Vasopneumatic Device;Neuromuscular re-education;Cryotherapy;Electrical Stimulation;Patient/family education   PT Next Visit Plan cont strengtheing and proprioception and flexibility   Consulted and Agree with Plan of Care Patient      Patient will benefit from skilled therapeutic intervention in order to improve the following deficits and impairments:  Pain, Decreased range of motion, Decreased strength  Visit Diagnosis: Acute pain of right knee  Muscle weakness (generalized)  Stiffness of right knee, not elsewhere classified     Problem List Patient Active Problem List   Diagnosis Date Noted  . Prediabetes 09/27/2015  . Right knee pain 09/21/2015  . Carpal tunnel syndrome 09/21/2015  . Ulnar neuropathy 09/21/2015  . Elevated PSA 09/21/2015   . Obesity (BMI 30.0-34.9) 02/15/2015  . Chronic obstructive pulmonary disease ([place]) 01/13/2014  . Gastritis 11/18/2013  . Lower urinary tract symptoms (LUTS) 10/29/2013  . S/P laparoscopic appendectomy 02/15/2013  . Sessile colonic polyp 02/15/2013   Kerin Perna, PTA 03/22/16 12:53 PM  [place], [place], [address] Phone: [phone]   Fax:  [phone]  Name: Hudsen Fei MRN: [id number] Date of Birth: [birth year]  PHYSICAL THERAPY DISCHARGE SUMMARY  Visits from Start of Care: 4  Current functional level related to goals / functional outcomes: See above, per MD note patient is doing better   Remaining deficits: unknown   Education / Equipment: HEP  Plan:                                                    Patient goals were not met. Patient is being discharged due to not returning since the last visit.  ?????    Jeral Pinch, PT 05/28/16 12:13 PM

## 2016-03-26 ENCOUNTER — Encounter: Payer: Self-pay | Admitting: Physical Therapy

## 2016-03-28 ENCOUNTER — Encounter: Payer: Self-pay | Admitting: Family Medicine

## 2016-03-28 ENCOUNTER — Ambulatory Visit (INDEPENDENT_AMBULATORY_CARE_PROVIDER_SITE_OTHER): Payer: BLUE CROSS/BLUE SHIELD

## 2016-03-28 ENCOUNTER — Ambulatory Visit (INDEPENDENT_AMBULATORY_CARE_PROVIDER_SITE_OTHER): Payer: BLUE CROSS/BLUE SHIELD | Admitting: Family Medicine

## 2016-03-28 VITALS — BP 130/73 | HR 76 | Wt 221.0 lb

## 2016-03-28 DIAGNOSIS — R69 Illness, unspecified: Secondary | ICD-10-CM | POA: Diagnosis not present

## 2016-03-28 DIAGNOSIS — R509 Fever, unspecified: Secondary | ICD-10-CM | POA: Diagnosis not present

## 2016-03-28 DIAGNOSIS — J441 Chronic obstructive pulmonary disease with (acute) exacerbation: Secondary | ICD-10-CM

## 2016-03-28 DIAGNOSIS — J111 Influenza due to unidentified influenza virus with other respiratory manifestations: Secondary | ICD-10-CM

## 2016-03-28 DIAGNOSIS — R05 Cough: Secondary | ICD-10-CM | POA: Diagnosis not present

## 2016-03-28 MED ORDER — BENZONATATE 200 MG PO CAPS: 200.0000 mg | ORAL_CAPSULE | Freq: Three times a day (TID) | ORAL | 0 refills | Status: DC | PRN

## 2016-03-28 MED ORDER — ALBUTEROL SULFATE HFA 108 (90 BASE) MCG/ACT IN AERS: 2.0000 | INHALATION_SPRAY | Freq: Four times a day (QID) | RESPIRATORY_TRACT | 0 refills | Status: DC | PRN

## 2016-03-28 MED ORDER — IPRATROPIUM BROMIDE 0.06 % NA SOLN: 2.0000 | NASAL | 6 refills | Status: DC | PRN

## 2016-03-28 MED ORDER — GUAIFENESIN-CODEINE 100-10 MG/5ML PO SOLN: 5.0000 mL | Freq: Every evening | ORAL | 1 refills | Status: DC | PRN

## 2016-03-28 NOTE — Progress Notes (Signed)
Christian Ortiz is a 66 y.o. male who presents to [facility] [place] [facility] today for cough congestion fevers chills body aches. Patient was exposed to flu earlier this week. He's been sick about 4 days. Cough is productive and interfering with sleep. He does note some wheezing as well. He denies any vomiting or diarrhea. He has tried some over-the-counter medications which have helped only a little. He's not been able to work because of his illness. He denies significant chest pain or palpitations or shortness of breath.   Past Medical History:  Diagnosis Date  . Carpal tunnel syndrome    Bilateral  . Ulnar neuropathy    Past Surgical History:  Procedure Laterality Date  . APPENDECTOMY     Social History  Substance Use Topics  . Smoking status: Never Smoker  . Smokeless tobacco: Never Used  . Alcohol use No   family history includes COPD in his father; Cancer in his mother; Diabetes in his father; Hypertension in his father.  ROS as above:  Medications: Current Outpatient Prescriptions  Medication Sig Dispense Refill  . diclofenac sodium (VOLTAREN) 1 % GEL Apply 2 g topically 4 (four) times daily. To affected joint. 100 g 11  . albuterol (PROVENTIL HFA;VENTOLIN HFA) 108 (90 Base) MCG/ACT inhaler Inhale 2 puffs into the lungs every 6 (six) hours as needed for wheezing or shortness of breath. 1 Inhaler 0  . benzonatate (TESSALON) 200 MG capsule Take 1 capsule (200 mg total) by mouth 3 (three) times daily as needed for cough. 45 capsule 0  . guaiFENesin-codeine 100-10 MG/5ML syrup Take 5 mLs by mouth at bedtime as needed for cough. 236 mL 1  . ipratropium (ATROVENT) 0.06 % nasal spray Place 2 sprays into both nostrils every 4 (four) hours as needed for rhinitis. 10 mL 6   No current facility-administered medications for this visit.    No Known Allergies  Health  Maintenance Health Maintenance  Topic Date Due  . TETANUS/TDAP  02/07/1972  . COLONOSCOPY  02/07/2003  . ZOSTAVAX  02/06/2013  . INFLUENZA VACCINE  Completed  . Hepatitis C Screening  Completed  . HIV Screening  Completed     Exam:  BP 130/73   Pulse 76   Wt 221 lb (100.2 kg)   BMI 29.97 kg/m  Gen: Well NAD HEENT: EOMI,  MMM Clear nasal discharge posterior pharynx with cobblestoning. Normal left tympanic membrane. Right tympanic membrane is occluded with cerumen Lungs: Normal work of breathing. Coarse breath sounds throughout Heart: RRR no MRG Abd: NABS, Soft. Nondistended, Nontender Exts: Brisk capillary refill, warm and well perfused.     No results found for this or any previous visit (from the past 72 hour(s)). Dg Chest 2 View  Result Date: 03/28/2016 CLINICAL DATA:  Cough and congestion with body aches and fever and chills. Symptoms for 4 days. EXAM: CHEST  2 VIEW COMPARISON:  None. FINDINGS: Cardiac silhouette is borderline enlarged. No mediastinal or hilar masses. No evidence of adenopathy. Clear lungs.  No pleural effusion.  No pneumothorax. Skeletal structures are demineralized but intact. IMPRESSION: No acute cardiopulmonary disease. Electronically Signed   By: Lajean Manes M.D.   On: 03/28/2016 17:02      Assessment and Plan: 66 y.o. male with influenza-like illness. Patient is outside of treatment window for Tamiflu. He's seems to be doing pretty however he is certainly symptomatic with COPD related exacerbation. Plan to treat with albuterol codeine cough syrup  Tessalon Perles and Atrovent nasal spray as well as over-the-counter medications. I would like to avoid prednisone if possible however if his symptoms persist I think that be a reasonable treatment to use.    Orders Placed This Encounter  Procedures  . DG Chest 2 View    Order Specific Question:   Reason for exam:    Answer:   Cough, assess intra-thoracic pathology    Order Specific Question:    Preferred imaging location?    Answer:   [facility]    Discussed warning signs or symptoms. Please see discharge instructions. Patient expresses understanding.

## 2016-03-28 NOTE — Patient Instructions (Signed)
Thank you for coming in today. Call or go to the emergency room if you get worse, have trouble breathing, have chest pains, or palpitations.   Get xray today.  Also consider albuterol.  Use codeine carefully. Do not drive with this medicine.    Influenza, Adult Influenza, more commonly known as "the flu," is a viral infection that primarily affects the respiratory tract. The respiratory tract includes organs that help you breathe, such as the lungs, nose, and throat. The flu causes many common cold symptoms, as well as a high fever and body aches. The flu spreads easily from person to person (is contagious). Getting a flu shot (influenza vaccination) every year is the best way to prevent influenza. What are the causes? Influenza is caused by a virus. You can catch the virus by:  Breathing in droplets from an infected person's cough or sneeze.  Touching something that was recently contaminated with the virus and then touching your mouth, nose, or eyes. What increases the risk? The following factors may make you more likely to get the flu:  Not cleaning your hands frequently with soap and water or alcohol-based hand sanitizer.  Having close contact with many people during cold and flu season.  Touching your mouth, eyes, or nose without washing or sanitizing your hands first.  Not drinking enough fluids or not eating a healthy diet.  Not getting enough sleep or exercise.  Being under a high amount of stress.  Not getting a yearly (annual) flu shot. You may be at a higher risk of complications from the flu, such as a severe lung infection (pneumonia), if you:  Are over the age of 1.  Are pregnant.  Have a weakened disease-fighting system (immune system). You may have a weakened immune system if you:  Have HIV or AIDS.  Are undergoing chemotherapy.  Aretaking medicines that reduce the activity of (suppress) the immune system.  Have a long-term (chronic) illness, such as  heart disease, kidney disease, diabetes, or lung disease.  Have a liver disorder.  Are obese.  Have anemia. What are the signs or symptoms? Symptoms of this condition typically last 4-10 days and may include:  Fever.  Chills.  Headache, body aches, or muscle aches.  Sore throat.  Cough.  Runny or congested nose.  Chest discomfort and cough.  Poor appetite.  Weakness or tiredness (fatigue).  Dizziness.  Nausea or vomiting. How is this diagnosed? This condition may be diagnosed based on your medical history and a physical exam. Your health care provider may do a nose or throat swab test to confirm the diagnosis. How is this treated? If influenza is detected early, you can be treated with antiviral medicine that can reduce the length of your illness and the severity of your symptoms. This medicine may be given by mouth (orally) or through an IV tube that is inserted in one of your veins. The goal of treatment is to relieve symptoms by taking care of yourself at home. This may include taking over-the-counter medicines, drinking plenty of fluids, and adding humidity to the air in your home. In some cases, influenza goes away on its own. Severe influenza or complications from influenza may be treated in a hospital. Follow these instructions at home:  Take over-the-counter and prescription medicines only as told by your health care provider.  Use a cool mist humidifier to add humidity to the air in your home. This can make breathing easier.  Rest as needed.  Drink enough fluid to  keep your urine clear or pale yellow.  Cover your mouth and nose when you cough or sneeze.  Wash your hands with soap and water often, especially after you cough or sneeze. If soap and water are not available, use hand sanitizer.  Stay home from work or school as told by your health care provider. Unless you are visiting your health care provider, try to avoid leaving home until your fever has  been gone for 24 hours without the use of medicine.  Keep all follow-up visits as told by your health care provider. This is important. How is this prevented?  Getting an annual flu shot is the best way to avoid getting the flu. You may get the flu shot in late summer, fall, or winter. Ask your health care provider when you should get your flu shot.  Wash your hands often or use hand sanitizer often.  Avoid contact with people who are sick during cold and flu season.  Eat a healthy diet, drink plenty of fluids, get enough sleep, and exercise regularly. Contact a health care provider if:  You develop new symptoms.  You have:  Chest pain.  Diarrhea.  A fever.  Your cough gets worse.  You produce more mucus.  You feel nauseous or you vomit. Get help right away if:  You develop shortness of breath or difficulty breathing.  Your skin or nails turn a bluish color.  You have severe pain or stiffness in your neck.  You develop a sudden headache or sudden pain in your face or ear.  You cannot stop vomiting. This information is not intended to replace advice given to you by your health care provider. Make sure you discuss any questions you have with your health care provider. Document Released: 02/09/2000 Document Revised: 07/20/2015 Document Reviewed: 12/06/2014 Elsevier Interactive Patient Education  2017 [employer].

## 2016-03-29 ENCOUNTER — Encounter: Payer: Self-pay | Admitting: Physical Therapy

## 2016-04-02 ENCOUNTER — Encounter: Payer: Self-pay | Admitting: Physical Therapy

## 2016-04-05 ENCOUNTER — Encounter: Payer: Self-pay | Admitting: Physical Therapy

## 2016-04-09 ENCOUNTER — Encounter: Payer: Self-pay | Admitting: Physical Therapy

## 2016-04-18 ENCOUNTER — Ambulatory Visit (INDEPENDENT_AMBULATORY_CARE_PROVIDER_SITE_OTHER): Payer: BLUE CROSS/BLUE SHIELD | Admitting: Family Medicine

## 2016-04-18 ENCOUNTER — Encounter: Payer: Self-pay | Admitting: Family Medicine

## 2016-04-18 VITALS — BP 135/97 | HR 70 | Wt 224.0 lb

## 2016-04-18 DIAGNOSIS — M25561 Pain in right knee: Secondary | ICD-10-CM

## 2016-04-18 NOTE — Progress Notes (Signed)
   Christian Ortiz is a 66 y.o. male who presents to [facility] today for follow up knee pain from work accident.  Christian Ortiz has improved right knee pain following steroid injection on 03/07/16 and several session of PT. is feeling much better and is nearly pain-free. He has a few more physical therapy sessions left but overall is quite well.    Past medical surgical social history reviewed. Medications and allergies reviewed.  Review of systems: No fevers chills nausea vomiting diarrhea chest pain or palpitations.   Exam:  BP (!) 135/97   Pulse 70   Wt 224 lb (101.6 kg)   BMI 30.38 kg/m  General: Well Developed, well nourished, and in no acute distress.  Neuro/Psych: Alert and oriented x3, extra-ocular muscles intact, able to move all 4 extremities, sensation grossly intact. Skin: Warm and dry, no rashes noted.  Respiratory: Not using accessory muscles, speaking in full sentences, trachea midline.  Cardiovascular: Pulses palpable, no extremity edema. Abdomen: Does not appear distended. MSK: Right knee well-appearing nontender normal motion.    No results found for this or any previous visit (from the past 48 hour(s)). No results found.    Assessment and Plan: 66 y.o. male with knee pain improving. Continue physical therapy until finished. Patient is released.    No orders of the defined types were placed in this encounter.   Discussed warning signs or symptoms. Please see discharge instructions. Patient expresses understanding.

## 2016-04-18 NOTE — Patient Instructions (Signed)
Thank you for coming in today. Return in 6 months or sooner if needed.   Let me know if the knee keeps bothering you.

## 2016-06-10 ENCOUNTER — Ambulatory Visit (INDEPENDENT_AMBULATORY_CARE_PROVIDER_SITE_OTHER): Payer: BLUE CROSS/BLUE SHIELD | Admitting: Family Medicine

## 2016-06-10 ENCOUNTER — Encounter: Payer: Self-pay | Admitting: Family Medicine

## 2016-06-10 VITALS — BP 150/83 | HR 66 | Wt 229.0 lb

## 2016-06-10 DIAGNOSIS — M25561 Pain in right knee: Secondary | ICD-10-CM

## 2016-06-10 DIAGNOSIS — L739 Follicular disorder, unspecified: Secondary | ICD-10-CM

## 2016-06-10 MED ORDER — FLUTICASONE PROPIONATE 50 MCG/ACT NA SUSP: 2.0000 | Freq: Every day | NASAL | 2 refills | Status: DC

## 2016-06-10 NOTE — Progress Notes (Signed)
Christian Ortiz is a 66 y.o. male who presents to [facility] today for follow-up knee pain and discuss a lesion on left chest wall.  Left knee pain: Patient has had ongoing left knee pain for some time now. 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 He had a steroid injection about 3 months ago that worked until recently. He notes pain and swelling worse with activity better with rest.  Lesion on chest wall: Patient has a small lesion on his left chest wall. He notes it occasionally drains some fluid and is overall feeling much better now. He had some pain initially. No fevers or chills.   Past Medical History:  Diagnosis Date  . Carpal tunnel syndrome    Bilateral  . Ulnar neuropathy    Past Surgical History:  Procedure Laterality Date  . APPENDECTOMY     Social History  Substance Use Topics  . Smoking status: Never Smoker  . Smokeless tobacco: Never Used  . Alcohol use No   family history includes COPD in his father; Cancer in his mother; Diabetes in his father; Hypertension in his father.  ROS as above:  Medications: Current Outpatient Prescriptions  Medication Sig Dispense Refill  . diclofenac sodium (VOLTAREN) 1 % GEL Apply 2 g topically 4 (four) times daily. To affected joint. 100 g 11  . ipratropium (ATROVENT) 0.06 % nasal spray Place 2 sprays into both nostrils every 4 (four) hours as needed for rhinitis. 10 mL 6  . fluticasone (FLONASE) 50 MCG/ACT nasal spray Place 2 sprays into both nostrils daily. 16 g 2   No current facility-administered medications for this visit.    No Known Allergies  Health Maintenance Health Maintenance  Topic Date Due  . COLONOSCOPY  02/07/2003  . INFLUENZA VACCINE  09/25/2016  . TETANUS/TDAP  02/26/2020  . Hepatitis C Screening  Completed  . HIV Screening  Completed     Exam:  BP (!) 150/83    Pulse 66   Wt 229 lb (103.9 kg)   BMI 31.06 kg/m  Gen: Well NAD HEENT: EOMI,  MMM Lungs: Normal work of breathing. CTABL Heart: RRR no MRG Abd: NABS, Soft. Nondistended, Nontender Exts: Brisk capillary refill, warm and well perfused.  Skin: Left chest wall small hyperpigmented macular lesion consistent with resolving folliculitis. Nontender. Right knee: Mild effusion. Otherwise normal-appearing Range of motion 0-120 with 1+ ridge patellar crepitations. Nontender. Stable ligamentous exam.  Procedure: Real-time Ultrasound Guided Injection of right knee  Device: GE Logiq E  Images permanently stored and available for review in the ultrasound unit. Verbal informed consent obtained. Discussed risks and benefits of procedure. Warned about infection bleeding damage to structures skin hypopigmentation and fat atrophy among others. Patient expresses understanding and agreement Time-out conducted.  Noted no overlying erythema, induration, or other signs of local infection.  Skin prepped in a sterile fashion.  Local anesthesia: Topical Ethyl chloride.  With sterile technique and under real time ultrasound guidance: 80mg  kenalog and 47ml marcaine injected easily.  Completed without difficulty  Pain immediately resolved suggesting accurate placement of the medication.  Advised to call if fevers/chills, erythema, induration, drainage, or persistent bleeding.  Images permanently stored and available for review in the ultrasound unit.  Impression: Technically successful ultrasound guided injection.    No results found for this or any previous visit (from the past 72 hour(s)). No results found.    Assessment and Plan: 66 y.o. male with  Right knee pain due to DJD. Steroid injection today. Return sooner if needed. Otherwise recheck in 3 months.  Skin Lesion: Normal resolving folliculitis. Continue to follow.    No orders of the defined types were placed in this  encounter.  Meds ordered this encounter  Medications  . fluticasone (FLONASE) 50 MCG/ACT nasal spray    Sig: Place 2 sprays into both nostrils daily.    Dispense:  16 g    Refill:  2     Discussed warning signs or symptoms. Please see discharge instructions. Patient expresses understanding.

## 2016-06-10 NOTE — Patient Instructions (Signed)
Thank you for coming in today. Call or go to the ER if you develop a large red swollen joint with extreme pain or oozing puss.  Return as needed.  Let me know if you get worse.   Take Flonase nasal spray.

## 2016-08-18 ENCOUNTER — Encounter: Payer: Self-pay | Admitting: Family Medicine

## 2016-09-23 ENCOUNTER — Encounter: Payer: Self-pay | Admitting: Family Medicine

## 2016-10-02 ENCOUNTER — Ambulatory Visit (INDEPENDENT_AMBULATORY_CARE_PROVIDER_SITE_OTHER): Payer: Worker's Compensation | Admitting: Family Medicine

## 2016-10-02 VITALS — BP 130/77 | HR 77 | Wt 232.0 lb

## 2016-10-02 DIAGNOSIS — M25561 Pain in right knee: Secondary | ICD-10-CM

## 2016-10-02 DIAGNOSIS — G8929 Other chronic pain: Secondary | ICD-10-CM

## 2016-10-03 NOTE — Progress Notes (Signed)
Christian Ortiz is a 66 y.o. male who presents to [facility] today for right-sided knee pain. Patient presents to clinic today for right knee pain. He notes pain and swelling. He's had pain like this previously thought to be DJD. He denies any radiating pain weakness or numbness fevers or chills. He denies any locking or catching or giving way. He received a steroid injection in his right knee on 06/10/2016 which worked until recently. He would like a repeat injection today if possible.   Past Medical History:  Diagnosis Date  . Carpal tunnel syndrome    Bilateral  . Ulnar neuropathy    Past Surgical History:  Procedure Laterality Date  . APPENDECTOMY     Social History  Substance Use Topics  . Smoking status: Never Smoker  . Smokeless tobacco: Never Used  . Alcohol use No     ROS:  As above   Medications: Current Outpatient Prescriptions  Medication Sig Dispense Refill  . diclofenac sodium (VOLTAREN) 1 % GEL Apply 2 g topically 4 (four) times daily. To affected joint. 100 g 11  . fluticasone (FLONASE) 50 MCG/ACT nasal spray Place 2 sprays into both nostrils daily. 16 g 2  . ipratropium (ATROVENT) 0.06 % nasal spray Place 2 sprays into both nostrils every 4 (four) hours as needed for rhinitis. 10 mL 6   No current facility-administered medications for this visit.    No Known Allergies   Exam:   General: Well Developed, well nourished, and in no acute distress.  Neuro/Psych: Alert and oriented x3, extra-ocular muscles intact, able to move all 4 extremities, sensation grossly intact. Skin: Warm and dry, no rashes noted.  Respiratory: Not using accessory muscles, speaking in full sentences, trachea midline.  Cardiovascular: Pulses palpable, no extremity edema. Abdomen: Does not appear distended. MSK: Right knee mild effusion normal-appearing otherwise with no erythema. Range of motion 0-120 with  crepitations. Nontender. Stable ligamentous exam.  Procedure: Real-time Ultrasound Guided Injection of right knee  Device: GE Logiq E  Images permanently stored and available for review in the ultrasound unit. Verbal informed consent obtained. Discussed risks and benefits of procedure. Warned about infection bleeding damage to structures skin hypopigmentation and fat atrophy among others. Patient expresses understanding and agreement Time-out conducted.  Noted no overlying erythema, induration, or other signs of local infection.  Skin prepped in a sterile fashion.  Local anesthesia: Topical Ethyl chloride.  With sterile technique and under real time ultrasound guidance: 80 mg of Kenalog and 4 mL of Marcaine injected easily.  Completed without difficulty  Pain immediately resolved suggesting accurate placement of the medication.  Advised to call if fevers/chills, erythema, induration, drainage, or persistent bleeding.  Images permanently stored and available for review in the ultrasound unit.  Impression: Technically successful ultrasound guided injection.   RIGHT KNEE - COMPLETE 4+ VIEW; LEFT KNEE - 1-2 VIEW  COMPARISON:  09/21/2015.  FINDINGS: Four views right knee:  Tricompartment degenerative changes most notable medial tibial femoral joint space followed by the patellofemoral joint space.  No fracture or dislocation  Small joint effusion.  Thickening of the patellar tendon appears stable.  Left knee two views:  Tricompartment degenerative changes most notable medial tibial femoral joint space.  No fracture or dislocation.  IMPRESSION: Tricompartment degenerative changes without fracture or dislocation noted.   Electronically Signed   By: Genia Del M.D.   On: 01/29/2016 17:52    No results found for this or any previous visit (  from the past 48 hour(s)). No results found.    Assessment and Plan: 66 y.o. male with right knee  pain likely due to exacerbation of DJD. Injection today. Plan for watchful waiting return as needed.    No orders of the defined types were placed in this encounter.  No orders of the defined types were placed in this encounter.   Discussed warning signs or symptoms. Please see discharge instructions. Patient expresses understanding.

## 2016-10-18 ENCOUNTER — Encounter: Payer: Self-pay | Admitting: Family Medicine

## 2016-12-25 ENCOUNTER — Encounter: Payer: Self-pay | Admitting: Family Medicine

## 2016-12-27 ENCOUNTER — Ambulatory Visit (INDEPENDENT_AMBULATORY_CARE_PROVIDER_SITE_OTHER): Payer: BLUE CROSS/BLUE SHIELD | Admitting: Family Medicine

## 2016-12-27 ENCOUNTER — Encounter: Payer: Self-pay | Admitting: Family Medicine

## 2016-12-27 VITALS — BP 136/83 | HR 74 | Ht 72.0 in | Wt 229.0 lb

## 2016-12-27 DIAGNOSIS — Z0000 Encounter for general adult medical examination without abnormal findings: Secondary | ICD-10-CM

## 2016-12-27 DIAGNOSIS — D122 Benign neoplasm of ascending colon: Secondary | ICD-10-CM

## 2016-12-27 DIAGNOSIS — E669 Obesity, unspecified: Secondary | ICD-10-CM | POA: Diagnosis not present

## 2016-12-27 DIAGNOSIS — R972 Elevated prostate specific antigen [PSA]: Secondary | ICD-10-CM

## 2016-12-27 DIAGNOSIS — R7303 Prediabetes: Secondary | ICD-10-CM

## 2016-12-27 DIAGNOSIS — Z23 Encounter for immunization: Secondary | ICD-10-CM | POA: Diagnosis not present

## 2016-12-27 DIAGNOSIS — K635 Polyp of colon: Secondary | ICD-10-CM

## 2016-12-27 NOTE — Progress Notes (Signed)
Christian Ortiz is a 66 y.o. male who presents to [facility]: [facility] today for well adult visit. Christian Ortiz is doing well overall. He denies any particular medical problems or issues currently today. He does note that he recently had PSA testing done as part of a pre-life insurance physical. He was rejected because his PSA had elevated to 6.25. He notes some mild urinary symptoms but overall feels well. No fevers chills nausea vomiting or diarrhea.    Past Medical History:  Diagnosis Date  . Carpal tunnel syndrome    Bilateral  . Ulnar neuropathy    Past Surgical History:  Procedure Laterality Date  . APPENDECTOMY     Social History  Substance Use Topics  . Smoking status: Never Smoker  . Smokeless tobacco: Never Used  . Alcohol use No   family history includes COPD in his father; Cancer in his mother; Diabetes in his father; Hypertension in his father.  ROS as above:  Medications: Current Outpatient Prescriptions  Medication Sig Dispense Refill  . diclofenac sodium (VOLTAREN) 1 % GEL Apply 2 g topically 4 (four) times daily. To affected joint. 100 g 11  . fluticasone (FLONASE) 50 MCG/ACT nasal spray Place 2 sprays into both nostrils daily. 16 g 2  . ipratropium (ATROVENT) 0.06 % nasal spray Place 2 sprays into both nostrils every 4 (four) hours as needed for rhinitis. 10 mL 6   No current facility-administered medications for this visit.    No Known Allergies  Health Maintenance Health Maintenance  Topic Date Due  . COLONOSCOPY  09/26/2015  . INFLUENZA VACCINE  09/25/2016  . TETANUS/TDAP  02/26/2020  . Hepatitis C Screening  Completed  . HIV Screening  Completed     Exam:  BP 136/83   Pulse 74   Ht 6' (1.829 m)   Wt 229 lb (103.9 kg)   BMI 31.06 kg/m  Gen: Well NAD HEENT: EOMI,  MMM Lungs: Normal work of breathing. CTABL Heart: RRR no MRG Abd:  NABS, Soft. Nondistended, Nontender Exts: Brisk capillary refill, warm and well perfused.   Lab Results  Component Value Date   PSA 4.42 (H) 09/26/2015   PSA 4.9 02/17/2015      No results found for this or any previous visit (from the past 72 hour(s)). No results found.    Assessment and Plan: 66 y.o. male with  Well adult. Doing reasonably well. Plan to check basic fasting labs as well as recheck PSA free and total. We'll also follow-up mildly elevated hemoglobin A1c in the last lab year ago.  Recommended work on weight loss and exercise.  Influenza and shingles vaccine given today.  Elevated PSA: Refer to urology and check PSA values.  Colon cancer screening due for repeat colonoscopy referral sent.   Orders Placed This Encounter  Procedures  . Flu Vaccine QUAD 36+ mos IM  . Varicella-zoster vaccine IM (Shingrix)  . CBC  . COMPLETE METABOLIC PANEL WITH GFR  . Lipid Panel w/reflex Direct LDL  . Hemoglobin A1c  . PSA, total and free  . Ambulatory referral to Gastroenterology    Referral Priority:   Routine    Referral Type:   Consultation    Referral Reason:   Specialty Services Required    Number of Visits Requested:   1  . Ambulatory referral to Urology    Referral Priority:   Routine    Referral Type:   Consultation  Referral Reason:   Specialty Services Required    Requested Specialty:   Urology    Number of Visits Requested:   1   No orders of the defined types were placed in this encounter.    Discussed warning signs or symptoms. Please see discharge instructions. Patient expresses understanding.

## 2016-12-27 NOTE — Patient Instructions (Addendum)
Thank you for coming in today. Get labs today.  Recheck yearly or sooner if needed.   You should hear from both Urology and Gastroenterology.  Schedule a nurse visit in >2 months for Shingrix vaccine #2.

## 2016-12-30 LAB — CBC
HEMATOCRIT: 42.3 % (ref 38.5–50.0)
Hemoglobin: 14.5 g/dL (ref 13.2–17.1)
MCH: 31.5 pg (ref 27.0–33.0)
MCHC: 34.3 g/dL (ref 32.0–36.0)
MCV: 91.8 fL (ref 80.0–100.0)
MPV: 10.4 fL (ref 7.5–12.5)
PLATELETS: 272 10*3/uL (ref 140–400)
RBC: 4.61 10*6/uL (ref 4.20–5.80)
RDW: 12.4 % (ref 11.0–15.0)
WBC: 9.5 10*3/uL (ref 3.8–10.8)

## 2016-12-30 LAB — LIPID PANEL W/REFLEX DIRECT LDL
Cholesterol: 194 mg/dL (ref <200)
HDL: 80 mg/dL (ref >40)
LDL Cholesterol (Calc): 98 mg/dL (calc)
NON-HDL CHOLESTEROL (CALC): 114 mg/dL (ref <130)
Total CHOL/HDL Ratio: 2.4 (calc) (ref <5.0)
Triglycerides: 75 mg/dL (ref <150)

## 2016-12-30 LAB — COMPLETE METABOLIC PANEL WITH GFR
AG RATIO: 1.7 (calc) (ref 1.0–2.5)
ALT: 12 U/L (ref 9–46)
AST: 18 U/L (ref 10–35)
Albumin: 4.5 g/dL (ref 3.6–5.1)
Alkaline phosphatase (APISO): 73 U/L (ref 40–115)
BILIRUBIN TOTAL: 0.7 mg/dL (ref 0.2–1.2)
BUN: 21 mg/dL (ref 7–25)
CHLORIDE: 106 mmol/L (ref 98–110)
CO2: 25 mmol/L (ref 20–32)
Calcium: 9.6 mg/dL (ref 8.6–10.3)
Creat: 0.94 mg/dL (ref 0.70–1.25)
GFR, EST NON AFRICAN AMERICAN: 86 mL/min/{1.73_m2} (ref >=60)
GFR, Est African American: 100 mL/min/{1.73_m2} (ref >=60)
GLOBULIN: 2.7 g/dL (ref 1.9–3.7)
Glucose, Bld: 86 mg/dL (ref 65–99)
POTASSIUM: 4.5 mmol/L (ref 3.5–5.3)
SODIUM: 141 mmol/L (ref 135–146)
Total Protein: 7.2 g/dL (ref 6.1–8.1)

## 2016-12-30 LAB — HEMOGLOBIN A1C
Hgb A1c MFr Bld: 5.5 % of total Hgb (ref <5.7)
Mean Plasma Glucose: 111 (calc)
eAG (mmol/L): 6.2 (calc)

## 2016-12-30 LAB — PSA, TOTAL AND FREE
PSA, % FREE: 14 % — AB (ref >25)
PSA, Free: 0.9 ng/mL
PSA, TOTAL: 6.5 ng/mL — AB (ref <=4.0)

## 2016-12-31 ENCOUNTER — Encounter: Payer: Self-pay | Admitting: Family Medicine

## 2016-12-31 ENCOUNTER — Other Ambulatory Visit: Payer: Self-pay | Admitting: Family Medicine

## 2017-01-03 ENCOUNTER — Encounter: Payer: Self-pay | Admitting: Family Medicine

## 2017-01-03 MED ORDER — ATORVASTATIN CALCIUM 20 MG PO TABS: 20.0000 mg | ORAL_TABLET | Freq: Every day | ORAL | 0 refills | Status: DC

## 2017-02-04 ENCOUNTER — Encounter: Payer: Self-pay | Admitting: Family Medicine

## 2017-02-04 ENCOUNTER — Ambulatory Visit (INDEPENDENT_AMBULATORY_CARE_PROVIDER_SITE_OTHER): Payer: BLUE CROSS/BLUE SHIELD | Admitting: Family Medicine

## 2017-02-04 VITALS — BP 122/66 | HR 91 | Temp 98.1°F | Ht 72.0 in | Wt 235.0 lb

## 2017-02-04 DIAGNOSIS — J0101 Acute recurrent maxillary sinusitis: Secondary | ICD-10-CM | POA: Diagnosis not present

## 2017-02-04 MED ORDER — PREDNISONE 10 MG PO TABS: 30.0000 mg | ORAL_TABLET | Freq: Every day | ORAL | 0 refills | Status: DC

## 2017-02-04 MED ORDER — CEFDINIR 300 MG PO CAPS: 300.0000 mg | ORAL_CAPSULE | Freq: Two times a day (BID) | ORAL | 0 refills | Status: DC

## 2017-02-04 NOTE — Patient Instructions (Signed)
Thank you for coming in today. Call or go to the emergency room if you get worse, have trouble breathing, have chest pains, or palpitations.  Continue over the counter medicines as needed.  If not better take prednisone If worse take omnicef antibiotics.  Return sooner if needed.    Sinusitis, Adult Sinusitis is soreness and inflammation of your sinuses. Sinuses are hollow spaces in the bones around your face. They are located:  Around your eyes.  In the middle of your forehead.  Behind your nose.  In your cheekbones.  Your sinuses and nasal passages are lined with a stringy fluid (mucus). Mucus normally drains out of your sinuses. When your nasal tissues get inflamed or swollen, the mucus can get trapped or blocked so air cannot flow through your sinuses. This lets bacteria, viruses, and funguses grow, and that leads to infection. Follow these instructions at home: Medicines  Take, use, or apply over-the-counter and prescription medicines only as told by your doctor. These may include nasal sprays.  If you were prescribed an antibiotic medicine, take it as told by your doctor. Do not stop taking the antibiotic even if you start to feel better. Hydrate and Humidify  Drink enough water to keep your pee (urine) clear or pale yellow.  Use a cool mist humidifier to keep the humidity level in your home above 50%.  Breathe in steam for 10-15 minutes, 3-4 times a day or as told by your doctor. You can do this in the bathroom while a hot shower is running.  Try not to spend time in cool or dry air. Rest  Rest as much as possible.  Sleep with your head raised (elevated).  Make sure to get enough sleep each night. General instructions  Put a warm, moist washcloth on your face 3-4 times a day or as told by your doctor. This will help with discomfort.  Wash your hands often with soap and water. If there is no soap and water, use hand sanitizer.  Do not smoke. Avoid being around  people who are smoking (secondhand smoke).  Keep all follow-up visits as told by your doctor. This is important. Contact a doctor if:  You have a fever.  Your symptoms get worse.  Your symptoms do not get better within 10 days. Get help right away if:  You have a very bad headache.  You cannot stop throwing up (vomiting).  You have pain or swelling around your face or eyes.  You have trouble seeing.  You feel confused.  Your neck is stiff.  You have trouble breathing. This information is not intended to replace advice given to you by your health care provider. Make sure you discuss any questions you have with your health care provider. Document Released: 07/31/2007 Document Revised: 10/08/2015 Document Reviewed: 12/07/2014 Elsevier Interactive Patient Education  [employer].

## 2017-02-04 NOTE — Progress Notes (Signed)
Christian Ortiz is a 66 y.o. male who presents to [facility] today for sinus congestion.   Patient presents with typical URI symptoms including sneezing, congestion, left sided facial pressure, and post-nasal drip resulting in cough. Patient has had symptoms since this past Saturday. Symptoms have not worsened or improved since that time. Patient states that symptoms are worse at night with lying flat. He has not had shortness of breath, fever, chills, gi distress. He denies headache, hearing changes, or dental pain. Patient has had similar symptoms in the past, usually occurring about this time of year. He has had both azithromycin and prednisone for this in the past. Both have helped.   Otherwise, patient appears well. He does not have any other complaints today. He is here because he wants to get out in front of any sinus issues this year.    Past Medical History:  Diagnosis Date  . Carpal tunnel syndrome    Bilateral  . Ulnar neuropathy    Past Surgical History:  Procedure Laterality Date  . APPENDECTOMY     Social History   Tobacco Use  . Smoking status: Never Smoker  . Smokeless tobacco: Never Used  Substance Use Topics  . Alcohol use: No   family history includes COPD in his father; Cancer in his mother; Diabetes in his father; Hypertension in his father.  ROS as above:  Medications: Current Outpatient Medications  Medication Sig Dispense Refill  . atorvastatin (LIPITOR) 20 MG tablet Take 1 tablet (20 mg total) daily by mouth. 90 tablet 0  . diclofenac sodium (VOLTAREN) 1 % GEL Apply 2 g topically 4 (four) times daily. To affected joint. 100 g 11  . fluticasone (FLONASE) 50 MCG/ACT nasal spray Place 2 sprays into both nostrils daily. 16 g 2  . ipratropium (ATROVENT) 0.06 % nasal spray Place 2 sprays into both nostrils every 4 (four) hours as needed  for rhinitis. 10 mL 6   No current facility-administered medications for this visit.    No Known Allergies  Health Maintenance Health Maintenance  Topic Date Due  . COLONOSCOPY  09/26/2015  . TETANUS/TDAP  02/26/2020  . INFLUENZA VACCINE  Completed  . Hepatitis C Screening  Completed  . HIV Screening  Completed     Exam:  BP 122/66   Pulse 91   Temp 98.1 F (36.7 C) (Oral)   Ht 6' (1.829 m)   Wt 235 lb (106.6 kg)   BMI 31.87 kg/m  Gen: Well NAD HEENT: EOMI,  MMM. Ears with cerumen impacted. Nares clear, no drip. Oropharynx without exudate or petechiae, minimally erythematous.  Lungs: Normal work of breathing. CTABL. No crackles, wheezes. Good air movement.  Heart: RRR no MRG Abd: NABS, Soft. Nondistended, Nontender Exts: Brisk capillary refill, warm and well perfused.   No results found for this or any previous visit (from the past 72 hour(s)). No results found.  Assessment and Plan: 66 y.o. male with URI vs sinusitis of likely viral etiology. Patient has been stable without shortness of breath or fever/chills. Patient has similar history of sinus congestion in the past that responded well to azithromycin.  Patient will benefit from symptomatic care. He has been using Flonase at home, but would benefit from Astelin or Atrovent for congestion symptoms. We will prescribe 5 day course of oral steroids to help with congestion. Prescribed back-up course of antibiotics to be taken if he does not improve in the next  5 days.    No orders of the defined types were placed in this encounter.  No orders of the defined types were placed in this encounter.    Discussed warning signs or symptoms. Please see discharge instructions. Patient expresses understanding.

## 2017-02-11 ENCOUNTER — Encounter: Payer: Self-pay | Admitting: Family Medicine

## 2017-02-12 MED ORDER — BENZONATATE 200 MG PO CAPS: 200.0000 mg | ORAL_CAPSULE | Freq: Three times a day (TID) | ORAL | 0 refills | Status: DC | PRN

## 2017-02-12 MED ORDER — GUAIFENESIN-CODEINE 100-10 MG/5ML PO SOLN: 5.0000 mL | Freq: Four times a day (QID) | ORAL | 0 refills | Status: DC | PRN

## 2017-03-17 ENCOUNTER — Ambulatory Visit (INDEPENDENT_AMBULATORY_CARE_PROVIDER_SITE_OTHER): Payer: Managed Care, Other (non HMO) | Admitting: Family Medicine

## 2017-03-17 VITALS — BP 147/84 | HR 68 | Temp 97.8°F | Resp 16

## 2017-03-17 DIAGNOSIS — Z23 Encounter for immunization: Secondary | ICD-10-CM

## 2017-03-17 NOTE — Progress Notes (Signed)
   Subjective:    Patient ID: Christian Ortiz, male    DOB: Jul 11, 1950, 67 y.o.   MRN: [id number]  HPI Patient here for 2nd Shingrix immunization; states no adverse effects from first dose.    Review of Systems     Objective:   Physical Exam        Assessment & Plan:  Patient tolerated injection well without complications.

## 2017-07-22 ENCOUNTER — Other Ambulatory Visit: Payer: Self-pay

## 2017-07-22 ENCOUNTER — Emergency Department (INDEPENDENT_AMBULATORY_CARE_PROVIDER_SITE_OTHER)
Admission: EM | Admit: 2017-07-22 | Discharge: 2017-07-22 | Disposition: A | Discharge status: Home / Self Care | Payer: Managed Care, Other (non HMO) | Source: Home / Self Care | Attending: Emergency Medicine | Admitting: Emergency Medicine

## 2017-07-22 DIAGNOSIS — R059 Cough, unspecified: Secondary | ICD-10-CM

## 2017-07-22 DIAGNOSIS — I4891 Unspecified atrial fibrillation: Secondary | ICD-10-CM | POA: Diagnosis not present

## 2017-07-22 DIAGNOSIS — I5022 Chronic systolic (congestive) heart failure: Secondary | ICD-10-CM | POA: Insufficient documentation

## 2017-07-22 DIAGNOSIS — R05 Cough: Secondary | ICD-10-CM

## 2017-07-22 MED ORDER — ENOXAPARIN SODIUM 100 MG/ML ~~LOC~~ SOLN
1.00 | SUBCUTANEOUS | Status: DC
Start: 2017-07-23 — End: 2017-07-22

## 2017-07-22 MED ORDER — GENERIC EXTERNAL MEDICATION
5.00 | Status: DC
Start: ? — End: 2017-07-22

## 2017-07-22 MED ORDER — BENZONATATE 100 MG PO CAPS
200.00 | ORAL_CAPSULE | ORAL | Status: DC
Start: ? — End: 2017-07-22

## 2017-07-22 MED ORDER — DICLOFENAC SODIUM 1 % TD GEL
TRANSDERMAL | Status: DC
Start: ? — End: 2017-07-22

## 2017-07-22 MED ORDER — BUDESONIDE-FORMOTEROL FUMARATE 80-4.5 MCG/ACT IN AERO
2.00 | INHALATION_SPRAY | RESPIRATORY_TRACT | Status: DC
Start: 2017-07-24 — End: 2017-07-22

## 2017-07-22 NOTE — ED Provider Notes (Signed)
[facility]    CSN: [id number] Arrival date & time: 07/22/17  1538     History   Chief Complaint Chief Complaint  Patient presents with  . Fever  . Generalized Body Aches  . Cough  Patient enters with cough cold and fever.  He became ill last night with sneezing.  Today he has had a cough productive at time of a greenish type phlegm.  He is a non-smoker he has had mild chest pain.  He has had no chest pain.  HPI Christian Ortiz is a 67 y.o. male.   HPI  Past Medical History:  Diagnosis Date  . Carpal tunnel syndrome    Bilateral  . Ulnar neuropathy     Patient Active Problem List   Diagnosis Date Noted  . Prediabetes 09/27/2015  . Right knee pain 09/21/2015  . Carpal tunnel syndrome 09/21/2015  . Ulnar neuropathy 09/21/2015  . Elevated PSA 09/21/2015  . Obesity (BMI 30.0-34.9) 02/15/2015  . Chronic obstructive pulmonary disease ([place]) 01/13/2014  . Gastritis 11/18/2013  . Lower urinary tract symptoms (LUTS) 10/29/2013  . S/P laparoscopic appendectomy 02/15/2013  . Sessile colonic polyp 02/15/2013    Past Surgical History:  Procedure Laterality Date  . APPENDECTOMY         Home Medications    Prior to Admission medications   Medication Sig Start Date End Date Taking? Authorizing Provider  atorvastatin (LIPITOR) 20 MG tablet Take 1 tablet (20 mg total) daily by mouth. 01/03/17   Gregor Hams, MD  diclofenac sodium (VOLTAREN) 1 % GEL Apply 2 g topically 4 (four) times daily. To affected joint. 09/21/15   Gregor Hams, MD  fluticasone (FLONASE) 50 MCG/ACT nasal spray Place 2 sprays into both nostrils daily. 06/10/16   Gregor Hams, MD    Family History Family History  Problem Relation Age of Onset  . Cancer Mother   . Hypertension Father   . COPD Father   . Diabetes Father     Social History Social History   Tobacco Use  . Smoking status: Never Smoker  . Smokeless tobacco: Never Used  Substance Use Topics  . Alcohol use: No  .  Drug use: No     Allergies   Patient has no known allergies.   Review of Systems Review of Systems  Constitutional: Positive for fatigue and fever.  HENT: Positive for congestion and sinus pressure.   Respiratory: Positive for cough and chest tightness. Negative for wheezing.   Cardiovascular: Negative for chest pain.     Physical Exam Triage Vital Signs ED Triage Vitals  Enc Vitals Group     BP 07/22/17 1609 120/84     Pulse Rate 07/22/17 1609 98     Resp --      Temp 07/22/17 1609 98.8 F (37.1 C)     Temp Source 07/22/17 1609 Oral     SpO2 07/22/17 1609 96 %     Weight 07/22/17 1610 221 lb (100.2 kg)     Height 07/22/17 1610 6' (1.829 m)     Head Circumference --      Peak Flow --      Pain Score 07/22/17 1610 0     Pain Loc --      Pain Edu? --      Excl. in [place]? --    No data found.  Updated Vital Signs BP 120/84 (BP Location: Right Arm)   Pulse 98   Temp 98.8 F (37.1 C) (  Oral)   Ht 6' (1.829 m)   Wt 221 lb (100.2 kg)   SpO2 96%   BMI 29.97 kg/m   Visual Acuity Right Eye Distance:   Left Eye Distance:   Bilateral Distance:    Right Eye Near:   Left Eye Near:    Bilateral Near:     Physical Exam  Constitutional: He appears well-developed and well-nourished.  HENT:  Nose: Nose normal.  Eyes: Pupils are equal, round, and reactive to light.  Neck: Normal range of motion.  Cardiovascular:  There is a very irregular fast rhythm without murmur.  Pulmonary/Chest: Effort normal and breath sounds normal. He has no wheezes.     UC Treatments / Results  Labs (all labs ordered are listed, but only abnormal results are displayed) Labs Reviewed - No data to display  EKG Atrial fibrillation with rapid ventricular response. Radiology No results found.  Procedures Procedures (including critical care time)  Medications Ordered in UC Medications - No data to display  Initial Impression / Assessment and Plan / UC Course  I have reviewed the  triage vital signs and the nursing notes.  Pertinent labs & imaging results that were available during my care of the patient were reviewed by me and considered in my medical decision making (see chart for details).  Patient seen with a respiratory infection.  He was found to be in atrial fibrillation and referred to the emergency room at [facility].  Triage nurse was called.  His wife will drive.   Final Clinical Impressions(s) / UC Diagnoses   Final diagnoses:  Atrial fibrillation with rapid ventricular response (HCC)  Cough     Discharge Instructions     Please go to the emergency room at [facility] for evaluation    ED Prescriptions    None     Controlled Substance Prescriptions [place] Controlled Substance Registry consulted? Not Applicable   Darlyne Russian, MD 07/22/17 [phone]

## 2017-07-22 NOTE — Discharge Instructions (Addendum)
Please go to the emergency room at [facility] for evaluation. °

## 2017-07-22 NOTE — ED Triage Notes (Signed)
Started last night with coughing, nasal congestion chest tightness.  Same sx today

## 2017-07-23 ENCOUNTER — Ambulatory Visit: Payer: Self-pay | Admitting: Family Medicine

## 2017-07-23 DIAGNOSIS — I340 Nonrheumatic mitral (valve) insufficiency: Secondary | ICD-10-CM

## 2017-07-23 DIAGNOSIS — I428 Other cardiomyopathies: Secondary | ICD-10-CM | POA: Insufficient documentation

## 2017-07-23 HISTORY — DX: Other cardiomyopathies: I42.8

## 2017-07-23 HISTORY — DX: Nonrheumatic mitral (valve) insufficiency: I34.0

## 2017-07-24 MED ORDER — SODIUM CHLORIDE 0.9 % IV SOLN
10.00 | INTRAVENOUS | Status: DC
Start: ? — End: 2017-07-24

## 2017-07-24 MED ORDER — ACETAMINOPHEN 325 MG PO TABS
650.00 | ORAL_TABLET | ORAL | Status: DC
Start: ? — End: 2017-07-24

## 2017-07-24 MED ORDER — FUROSEMIDE 40 MG PO TABS
40.00 | ORAL_TABLET | ORAL | Status: DC
Start: 2017-07-25 — End: 2017-07-24

## 2017-07-24 MED ORDER — RIVAROXABAN 10 MG PO TABS
20.00 | ORAL_TABLET | ORAL | Status: DC
Start: 2017-07-24 — End: 2017-07-24

## 2017-07-24 MED ORDER — GUAIFENESIN 100 MG/5ML PO SYRP
200.00 | ORAL_SOLUTION | ORAL | Status: DC
Start: ? — End: 2017-07-24

## 2017-07-24 MED ORDER — METOPROLOL SUCCINATE ER 50 MG PO TB24
50.00 | ORAL_TABLET | ORAL | Status: DC
Start: 2017-07-25 — End: 2017-07-24

## 2017-07-24 MED ORDER — GENERIC EXTERNAL MEDICATION
Status: DC
Start: ? — End: 2017-07-24

## 2017-07-24 MED ORDER — POLYETHYLENE GLYCOL 3350 17 G PO PACK
17.00 | PACK | ORAL | Status: DC
Start: ? — End: 2017-07-24

## 2017-07-24 MED ORDER — ONDANSETRON HCL 4 MG PO TABS
4.00 | ORAL_TABLET | ORAL | Status: DC
Start: ? — End: 2017-07-24

## 2017-07-24 MED ORDER — NITROGLYCERIN 0.4 MG SL SUBL
0.40 | SUBLINGUAL_TABLET | SUBLINGUAL | Status: DC
Start: ? — End: 2017-07-24

## 2017-07-24 MED ORDER — ALUM & MAG HYDROXIDE-SIMETH 200-200-20 MG/5ML PO SUSP
30.00 | ORAL | Status: DC
Start: ? — End: 2017-07-24

## 2017-07-24 MED ORDER — ALBUTEROL SULFATE (2.5 MG/3ML) 0.083% IN NEBU
2.50 | INHALATION_SOLUTION | RESPIRATORY_TRACT | Status: DC
Start: ? — End: 2017-07-24

## 2017-07-24 MED ORDER — AMIODARONE HCL 200 MG PO TABS
400.00 | ORAL_TABLET | ORAL | Status: DC
Start: 2017-07-24 — End: 2017-07-24

## 2017-07-24 MED ORDER — HYDRALAZINE HCL 20 MG/ML IJ SOLN
10.00 | INTRAMUSCULAR | Status: DC
Start: ? — End: 2017-07-24

## 2017-07-25 ENCOUNTER — Other Ambulatory Visit: Payer: Self-pay

## 2017-07-25 MED ORDER — FLUTICASONE PROPIONATE 50 MCG/ACT NA SUSP: 2.0000 | Freq: Every day | NASAL | 0 refills | Status: DC

## 2017-08-01 ENCOUNTER — Telehealth: Payer: Self-pay

## 2017-08-01 DIAGNOSIS — Z9119 Patient's noncompliance with other medical treatment and regimen: Secondary | ICD-10-CM

## 2017-08-01 DIAGNOSIS — Z91199 Patient's noncompliance with other medical treatment and regimen due to unspecified reason: Secondary | ICD-10-CM

## 2017-08-01 HISTORY — DX: Patient's noncompliance with other medical treatment and regimen: Z91.19

## 2017-08-01 HISTORY — DX: Patient's noncompliance with other medical treatment and regimen due to unspecified reason: Z91.199

## 2017-08-01 NOTE — Telephone Encounter (Signed)
Notes sent to scheduling for appointment from [facility]iology [facility]).

## 2017-08-04 ENCOUNTER — Telehealth: Payer: Self-pay

## 2017-08-04 NOTE — Telephone Encounter (Signed)
Notes sent to scheduling from [facility].

## 2017-08-06 ENCOUNTER — Telehealth: Payer: Self-pay

## 2017-08-06 NOTE — Telephone Encounter (Signed)
SENT REFERRAL TO SCHEDULING, FILED NOTES 

## 2017-08-07 ENCOUNTER — Telehealth: Payer: Self-pay | Admitting: Family Medicine

## 2017-08-07 NOTE — Telephone Encounter (Signed)
Letter received from [facility] care management Basilio Cairo RD Case Manager They are trying to reach Mr Christian Ortiz ID [id number]  Phone [phone] ext [phone]  I called and left a message.

## 2017-10-01 ENCOUNTER — Other Ambulatory Visit: Payer: Self-pay

## 2017-10-01 DIAGNOSIS — I340 Nonrheumatic mitral (valve) insufficiency: Secondary | ICD-10-CM

## 2017-10-01 DIAGNOSIS — G4730 Sleep apnea, unspecified: Secondary | ICD-10-CM

## 2017-10-01 DIAGNOSIS — I482 Chronic atrial fibrillation, unspecified: Secondary | ICD-10-CM

## 2017-10-01 DIAGNOSIS — I5021 Acute systolic (congestive) heart failure: Secondary | ICD-10-CM | POA: Insufficient documentation

## 2017-10-01 DIAGNOSIS — I10 Essential (primary) hypertension: Secondary | ICD-10-CM

## 2017-10-01 HISTORY — DX: Essential (primary) hypertension: I10

## 2017-10-01 HISTORY — DX: Nonrheumatic mitral (valve) insufficiency: I34.0

## 2017-10-01 HISTORY — DX: Chronic atrial fibrillation, unspecified: I48.20

## 2017-10-01 HISTORY — DX: Sleep apnea, unspecified: G47.30

## 2017-10-20 ENCOUNTER — Telehealth: Payer: Self-pay | Admitting: Family Medicine

## 2017-10-20 NOTE — Telephone Encounter (Signed)
[employer] case manager Basilio Cairo RN sent me a letter saying they have been trying to contact Mr. Zambrana's but having difficulty.   Goal support customers and receiving the care and other services they may need as well as let them know the services that are available to them.  Request please contact information into chart.  Christella Scheuermann ID is [id number]

## 2017-10-21 LAB — PSA: PSA: 7.6

## 2017-10-22 ENCOUNTER — Ambulatory Visit (INDEPENDENT_AMBULATORY_CARE_PROVIDER_SITE_OTHER): Payer: Managed Care, Other (non HMO) | Admitting: Cardiology

## 2017-10-22 ENCOUNTER — Encounter: Payer: Self-pay | Admitting: Cardiology

## 2017-10-22 VITALS — BP 124/70 | HR 57 | Ht 72.0 in | Wt 220.0 lb

## 2017-10-22 DIAGNOSIS — I481 Persistent atrial fibrillation: Secondary | ICD-10-CM

## 2017-10-22 DIAGNOSIS — I428 Other cardiomyopathies: Secondary | ICD-10-CM

## 2017-10-22 DIAGNOSIS — I10 Essential (primary) hypertension: Secondary | ICD-10-CM | POA: Diagnosis not present

## 2017-10-22 DIAGNOSIS — I4819 Other persistent atrial fibrillation: Secondary | ICD-10-CM

## 2017-10-22 NOTE — Progress Notes (Signed)
Electrophysiology Office Note   Date:  10/22/2017   ID:  Christian Ortiz, Christian Ortiz August 12, 1950, MRN [id number]  PCP:  Gregor Hams, MD  Cardiologist:  [place] Primary Electrophysiologist:  Jeston Junkins Meredith Leeds, MD    No chief complaint on file.    History of Present Illness: Christian Ortiz is a 67 y.o. male who is being seen today for the evaluation of atrial fibrillation at the request of Megan Hulen. Presenting today for electrophysiology evaluation.  He has a history of atrial fibrillation, COPD, hypertension, nonischemic cardiomyopathy, obesity, obstructive sleep apnea.  He presented to the emergency room 07/22/2017 with atrial fibrillation and rapid rates.  He was admitted to the hospital with an echo showing a mild cardiomyopathy with an EF of 40 to 45%.  He is found to have 3+ mitral insufficiency though his mitral valve looks normal on echo.  He was started on amiodarone and underwent a TEE guided cardioversion which was unsuccessful after 3 shocks.  On return to clinic, he was in sinus rhythm.      Today, he denies symptoms of palpitations, chest pain, shortness of breath, orthopnea, PND, lower extremity edema, claudication, dizziness, presyncope, syncope, bleeding, or neurologic sequela. The patient is tolerating medications without difficulties.    Past Medical History:  Diagnosis Date  . Carpal tunnel syndrome    Bilateral  . Chronic atrial fibrillation ([place]) 10/01/2017  . Chronic obstructive pulmonary disease ([place]) 01/13/2014   Overview:  Mild.  [place] [facility] is following.  . Chronic systolic heart failure ([place])   . Elevated PSA 09/21/2015  . Essential hypertension 04/25/2014  . Hypertension, essential 10/01/2017  . Non-rheumatic mitral regurgitation 07/23/2017  . Noncompliance 08/01/2017  . Nonischemic cardiomyopathy ([place]) 07/23/2017  . Nonrheumatic mitral valve regurgitation 10/01/2017  . Obesity (BMI 30-39.9) 02/15/2015  . OSA (obstructive sleep apnea) 05/10/2014    . Persistent atrial fibrillation ([place])   . Prediabetes 09/27/2015  . S/P laparoscopic appendectomy 02/15/2013  . Sleep apnea                          DECIDED AGAINST CPAP 10/01/2017  . Ulnar neuropathy    Past Surgical History:  Procedure Laterality Date  . APPENDECTOMY  02/15/2013  . sessile colonic polyp N/A 02/15/2013     Current Outpatient Medications  Medication Sig Dispense Refill  . amiodarone (PACERONE) 400 MG tablet Take 400 mg by mouth daily. Take one tablet (400) mg by  Mouth daily.    Marland Kitchen atorvastatin (LIPITOR) 20 MG tablet Take 1 tablet (20 mg total) daily by mouth. 90 tablet 0  . diclofenac sodium (VOLTAREN) 1 % GEL Apply 2 g topically 4 (four) times daily. To affected joint. 100 g 11  . fluticasone (FLONASE) 50 MCG/ACT nasal spray Place 2 sprays into both nostrils daily. APPOINTMENT NEEDED FOR FURTHER REFILLS 16 g 0  . Fluticasone-Salmeterol (ADVAIR) 250-50 MCG/DOSE AEPB Inhale 1 puff into the lungs 2 (two) times daily.    . furosemide (LASIX) 20 MG tablet Take 20 mg by mouth daily.    Marland Kitchen lisinopril (PRINIVIL,ZESTRIL) 5 MG tablet Take 5 mg by mouth daily.    . metoprolol succinate (TOPROL-XL) 50 MG 24 hr tablet Take 50 mg by mouth daily. Take with or immediately following a meal.    . rivaroxaban (XARELTO) 20 MG TABS tablet Take 20 mg by mouth daily.     No current facility-administered medications for this visit.     Allergies:  Patient has no known allergies.   Social History:  The patient  reports that he has never smoked. He has never used smokeless tobacco. He reports that he does not drink alcohol or use drugs.   Family History:  The patient's family history includes COPD in his father; Cancer in his mother; Diabetes in his father; Hypertension in his father.    ROS:  Please see the history of present illness.   Otherwise, review of systems is positive for sweats, fatigue, cough, shortness of breath, balance problems.   All other systems are reviewed and negative.     PHYSICAL EXAM: VS:  There were no vitals taken for this visit. , BMI There is no height or weight on file to calculate BMI. GEN: Well nourished, well developed, in no acute distress  HEENT: normal  Neck: no JVD, carotid bruits, or masses Cardiac: RRR; no murmurs, rubs, or gallops,no edema  Respiratory:  clear to auscultation bilaterally, normal work of breathing GI: soft, nontender, nondistended, + BS MS: no deformity or atrophy  Skin: warm and dry Neuro:  Strength and sensation are intact Psych: euthymic mood, full affect  EKG:  EKG is ordered today. Personal review of the ekg ordered shows sinus rhythm, rate 57, LVH, prolonged QTC  Recent Labs: 12/27/2016: ALT 12; BUN 21; Creat 0.94; Hemoglobin 14.5; Platelets 272; Potassium 4.5; Sodium 141    Lipid Panel     Component Value Date/Time   CHOL 194 12/27/2016 1114   TRIG 75 12/27/2016 1114   HDL 80 12/27/2016 1114   CHOLHDL 2.4 12/27/2016 1114   VLDL 10 09/26/2015 1114   LDLCALC 98 12/27/2016 1114     Wt Readings from Last 3 Encounters:  07/22/17 221 lb (100.2 kg)  02/04/17 235 lb (106.6 kg)  12/27/16 229 lb (103.9 kg)      Other studies Reviewed: Additional studies/ records that were reviewed today include: TTE 07/23/17  Review of the above records today demonstrates:  The left ventricle is mildly dilated. There is mild concentric left ventricular hypertrophy. There is mild diffuse hypokinesis of the left ventricle. The left ventricular ejection fraction is moderately reduced (40-45%). Grade I mild diastolic dysfunction; abnormal relaxation pattern. The left atrium is moderately dilated. There is mild (1+) tricuspid regurgitation. There is mild aortic valve thickening with trace regurgitation.   ASSESSMENT AND PLAN:  1.  Persistent atrial fibrillation: Currently in sinus rhythm on amiodarone.  He would like to stop amiodarone due to long-term side effects.  I discussed with him the possibility of ablation  versus medical management.  Risks and benefits of ablation were discussed and include bleeding, tamponade, heart block, stroke, damage to chest organs.  He understands these risks and is agreed to the procedure.  We Kosisochukwu Goldberg order a CT scan prior to his procedure.  He has a CT scan scheduled with his primary cardiologist.  He Akbar Sacra likely not need his CT with primary cardiology at this point.  We Jerl Munyan evaluate his coronary arteries.  This patients CHA2DS2-VASc Score and unadjusted Ischemic Stroke Rate (% per year) is equal to 3.2 % stroke rate/year from a score of 3  Above score calculated as 1 point each if present [CHF, HTN, DM, Vascular=MI/PAD/Aortic Plaque, Age if 65-74, or Male] Above score calculated as 2 points each if present [Age > 75, or Stroke/TIA/TE]  2.  Hypertension: Well-controlled.  No changes.  3.  Nonischemic cardiomyopathy: Likely due to rapid atrial fibrillation.  Plan for ablation.  Currently on optimal medical  therapy.    Current medicines are reviewed at length with the patient today.   The patient does not have concerns regarding his medicines.  The following changes were made today:  none  Labs/ tests ordered today include:  No orders of the defined types were placed in this encounter.  Case discussed with primary cardiology  Disposition:   FU with Colby Catanese 3 months  Signed, Pepe Mineau Meredith Leeds, MD  10/22/2017 1:46 PM     [place] [place] [address] [phone] (office) [phone] (fax)

## 2017-10-22 NOTE — Patient Instructions (Addendum)
Medication Instructions:  Your physician recommends that you continue on your current medications as directed. Please refer to the Current Medication list given to you today. If you need a refill on your cardiac medications before your next appointment, please call your pharmacy.  Labwork: None ordered.  Testing/Procedures:  Your physician has requested that you have cardiac CT. Cardiac computed tomography (CT) is a painless test that uses an x-ray machine to take clear, detailed pictures of your heart. For further information please visit [facility]. Please follow instruction sheet as given.  Schedule cardiac CT  Your physician has recommended that you have an ablation. Catheter ablation is a medical procedure used to treat some cardiac arrhythmias (irregular heartbeats). During catheter ablation, a long, thin, flexible tube is put into a blood vessel in your groin (upper thigh), or neck. This tube is called an ablation catheter. It is then guided to your heart through the blood vessel. Radio frequency waves destroy small areas of heart tissue where abnormal heartbeats may cause an arrhythmia to start. Please see the instruction sheet given to you today.  Follow-Up: You will follow up with Roderic Palau, NP with the Atrial fibrillation (Afib) clinic 4 weeks after your ablation.  You will follow up with Dr. Curt Bears 3 months after your procedure.  CARDIAC CT INSTRUCTIONS:  Please arrive at the [facility] main entrance of [facility] [place], [place] [address] [phone]  Proceed to the [facility] Radiology Department (First Floor).  Please follow these instructions carefully (unless otherwise directed):   On the Night Before the Test: . Drink plenty of water. . Do not consume any caffeinated/decaffeinated beverages or chocolate 12 hours prior to your test. . Do not take any antihistamines 12 hours prior to your  test.  On the Day of the Test: . Drink plenty of water. Do not drink any water within one hour of the test. . Do not eat any food 4 hours prior to the test. . You may take your regular medications prior to the test- MAKE SURE you TAKE your METOPROLOL.  After the Test: . Drink plenty of water. . After receiving IV contrast, you may experience a mild flushed feeling. This is normal. . On occasion, you may experience a mild rash up to 24 hours after the test. This is not dangerous. If this occurs, you can take Benadryl 25 mg and increase your fluid intake. . If you experience trouble breathing, this can be serious. If it is severe call 911 IMMEDIATELY. If it is mild, please call our office.   ABLATION INSTRUCTIONS:  Please arrive at the [facility] main entrance of [facility] hospital at: 7:30 am on November 13, 2017  Do not eat or drink after midnight prior to procedure  On the morning of your procedure do not take any medications.  Plan for one night stay.    You will need someone to drive you home at discharge.     Cardiac Ablation Cardiac ablation is a procedure to disable (ablate) a small amount of heart tissue in very specific places. The heart has many electrical connections. Sometimes these connections are abnormal and can cause the heart to beat very fast or irregularly. Ablating some of the problem areas can improve the heart rhythm or return it to normal. Ablation may be done for people who:  Have Wolff-Parkinson-White syndrome.  Have fast heart rhythms (tachycardia).  Have taken medicines for an abnormal heart rhythm (arrhythmia) that  were not effective or caused side effects.  Have a high-risk heartbeat that may be life-threatening.  During the procedure, a small incision is made in the neck or the groin, and a long, thin, flexible tube (catheter) is inserted into the incision and moved to the heart. Small devices (electrodes) on the tip of the catheter will send  out electrical currents. A type of X-ray (fluoroscopy) will be used to help guide the catheter and to provide images of the heart. Tell a health care provider about:  Any allergies you have.  All medicines you are taking, including vitamins, herbs, eye drops, creams, and over-the-counter medicines.  Any problems you or family members have had with anesthetic medicines.  Any blood disorders you have.  Any surgeries you have had.  Any medical conditions you have, such as kidney failure.  Whether you are pregnant or may be pregnant. What are the risks? Generally, this is a safe procedure. However, problems may occur, including:  Infection.  Bruising and bleeding at the catheter insertion site.  Bleeding into the chest, especially into the sac that surrounds the heart. This is a serious complication.  Stroke or blood clots.  Damage to other structures or organs.  Allergic reaction to medicines or dyes.  Need for a permanent pacemaker if the normal electrical system is damaged. A pacemaker is a small computer that sends electrical signals to the heart and helps your heart beat normally.  The procedure not being fully effective. This may not be recognized until months later. Repeat ablation procedures are sometimes required.  What happens before the procedure?  Follow instructions from your health care provider about eating or drinking restrictions.  Ask your health care provider about: ? Changing or stopping your regular medicines. This is especially important if you are taking diabetes medicines or blood thinners. ? Taking medicines such as aspirin and ibuprofen. These medicines can thin your blood. Do not take these medicines before your procedure if your health care provider instructs you not to.  Plan to have someone take you home from the hospital or clinic.  If you will be going home right after the procedure, plan to have someone with you for 24 hours. What happens  during the procedure?  To lower your risk of infection: ? Your health care team will wash or sanitize their hands. ? Your skin will be washed with soap. ? Hair may be removed from the incision area.  An IV tube will be inserted into one of your veins.  You will be given a medicine to help you relax (sedative).  The skin on your neck or groin will be numbed.  An incision will be made in your neck or your groin.  A needle will be inserted through the incision and into a large vein in your neck or groin.  A catheter will be inserted into the needle and moved to your heart.  Dye may be injected through the catheter to help your surgeon see the area of the heart that needs treatment.  Electrical currents will be sent from the catheter to ablate heart tissue in desired areas. There are three types of energy that may be used to ablate heart tissue: ? Heat (radiofrequency energy). ? Laser energy. ? Extreme cold (cryoablation).  When the necessary tissue has been ablated, the catheter will be removed.  Pressure will be held on the catheter insertion area to prevent excessive bleeding.  A bandage (dressing) will be placed over the catheter insertion area.  The procedure may vary among health care providers and hospitals. What happens after the procedure?  Your blood pressure, heart rate, breathing rate, and blood oxygen level will be monitored until the medicines you were given have worn off.  Your catheter insertion area will be monitored for bleeding. You will need to lie still for a few hours to ensure that you do not bleed from the catheter insertion area.  Do not drive for 24 hours or as long as directed by your health care provider. Summary  Cardiac ablation is a procedure to disable (ablate) a small amount of heart tissue in very specific places. Ablating some of the problem areas can improve the heart rhythm or return it to normal.  During the procedure, electrical currents  will be sent from the catheter to ablate heart tissue in desired areas. This information is not intended to replace advice given to you by your health care provider. Make sure you discuss any questions you have with your health care provider. Document Released: 06/30/2008 Document Revised: 01/01/2016 Document Reviewed: 01/01/2016 Elsevier Interactive Patient Education  [employer].

## 2017-11-05 ENCOUNTER — Telehealth: Payer: Self-pay | Admitting: *Deleted

## 2017-11-05 ENCOUNTER — Encounter: Payer: Self-pay | Admitting: *Deleted

## 2017-11-05 NOTE — Telephone Encounter (Signed)
Pt called in and spoke to someone else in our office. He was asking about CT and ablation instructions as they have lost the paperwork they received when they were here last.  Informed that I would send instruction via [place] letters. Pt appreciative of the help.

## 2017-11-06 ENCOUNTER — Other Ambulatory Visit: Payer: Self-pay | Admitting: *Deleted

## 2017-11-06 ENCOUNTER — Ambulatory Visit (HOSPITAL_COMMUNITY): Admission: RE | Admit: 2017-11-06 | Payer: Managed Care, Other (non HMO) | Source: Ambulatory Visit

## 2017-11-06 ENCOUNTER — Ambulatory Visit (HOSPITAL_COMMUNITY)
Admission: RE | Admit: 2017-11-06 | Discharge: 2017-11-06 | Disposition: A | Discharge status: Home / Self Care | Payer: Managed Care, Other (non HMO) | Source: Ambulatory Visit | Attending: Cardiology | Admitting: Cardiology

## 2017-11-06 DIAGNOSIS — Z01812 Encounter for preprocedural laboratory examination: Secondary | ICD-10-CM

## 2017-11-06 DIAGNOSIS — I4819 Other persistent atrial fibrillation: Secondary | ICD-10-CM

## 2017-11-06 DIAGNOSIS — I481 Persistent atrial fibrillation: Secondary | ICD-10-CM | POA: Insufficient documentation

## 2017-11-06 DIAGNOSIS — I480 Paroxysmal atrial fibrillation: Secondary | ICD-10-CM

## 2017-11-06 LAB — POCT I-STAT CREATININE: CREATININE: 1.1 mg/dL (ref 0.61–1.24)

## 2017-11-06 MED ORDER — IOPAMIDOL (ISOVUE-370) INJECTION 76%
100.0000 mL | Freq: Once | INTRAVENOUS | Status: AC | PRN
  Administered 2017-11-06: 80 mL via INTRAVENOUS

## 2017-11-10 ENCOUNTER — Other Ambulatory Visit: Payer: Managed Care, Other (non HMO) | Admitting: *Deleted

## 2017-11-10 DIAGNOSIS — I480 Paroxysmal atrial fibrillation: Secondary | ICD-10-CM

## 2017-11-10 DIAGNOSIS — Z01812 Encounter for preprocedural laboratory examination: Secondary | ICD-10-CM

## 2017-11-11 ENCOUNTER — Other Ambulatory Visit: Payer: Self-pay

## 2017-11-11 LAB — BASIC METABOLIC PANEL
BUN/Creatinine Ratio: 19 (ref 10–24)
BUN: 21 mg/dL (ref 8–27)
CALCIUM: 9.3 mg/dL (ref 8.6–10.2)
CHLORIDE: 103 mmol/L (ref 96–106)
CO2: 23 mmol/L (ref 20–29)
Creatinine, Ser: 1.12 mg/dL (ref 0.76–1.27)
GFR calc Af Amer: 80 mL/min/{1.73_m2} (ref >59)
GFR, EST NON AFRICAN AMERICAN: 69 mL/min/{1.73_m2} (ref >59)
GLUCOSE: 89 mg/dL (ref 65–99)
POTASSIUM: 4.5 mmol/L (ref 3.5–5.2)
SODIUM: 141 mmol/L (ref 134–144)

## 2017-11-11 LAB — CBC
HEMOGLOBIN: 13.3 g/dL (ref 13.0–17.7)
Hematocrit: 40.2 % (ref 37.5–51.0)
MCH: 30.9 pg (ref 26.6–33.0)
MCHC: 33.1 g/dL (ref 31.5–35.7)
MCV: 93 fL (ref 79–97)
PLATELETS: 285 10*3/uL (ref 150–450)
RBC: 4.31 x10E6/uL (ref 4.14–5.80)
RDW: 14.2 % (ref 12.3–15.4)
WBC: 8.1 10*3/uL (ref 3.4–10.8)

## 2017-11-13 ENCOUNTER — Encounter (HOSPITAL_COMMUNITY): Payer: Self-pay | Admitting: Certified Registered Nurse Anesthetist

## 2017-11-13 ENCOUNTER — Ambulatory Visit (HOSPITAL_COMMUNITY): Payer: Managed Care, Other (non HMO) | Admitting: Certified Registered Nurse Anesthetist

## 2017-11-13 ENCOUNTER — Other Ambulatory Visit: Payer: Self-pay

## 2017-11-13 ENCOUNTER — Ambulatory Visit (HOSPITAL_COMMUNITY)
Admission: RE | Admit: 2017-11-13 | Discharge: 2017-11-14 | Disposition: A | Discharge status: Home / Self Care | Payer: Managed Care, Other (non HMO) | Source: Ambulatory Visit | Attending: Cardiology | Admitting: Cardiology

## 2017-11-13 ENCOUNTER — Encounter (HOSPITAL_COMMUNITY)
Admission: RE | Disposition: A | Discharge status: Home / Self Care | Payer: Self-pay | Source: Ambulatory Visit | Attending: Cardiology

## 2017-11-13 DIAGNOSIS — I5022 Chronic systolic (congestive) heart failure: Secondary | ICD-10-CM | POA: Insufficient documentation

## 2017-11-13 DIAGNOSIS — I4819 Other persistent atrial fibrillation: Secondary | ICD-10-CM | POA: Diagnosis present

## 2017-11-13 DIAGNOSIS — G5603 Carpal tunnel syndrome, bilateral upper limbs: Secondary | ICD-10-CM | POA: Diagnosis not present

## 2017-11-13 DIAGNOSIS — Z79899 Other long term (current) drug therapy: Secondary | ICD-10-CM | POA: Insufficient documentation

## 2017-11-13 DIAGNOSIS — E669 Obesity, unspecified: Secondary | ICD-10-CM | POA: Insufficient documentation

## 2017-11-13 DIAGNOSIS — Z86010 Personal history of colonic polyps: Secondary | ICD-10-CM | POA: Insufficient documentation

## 2017-11-13 DIAGNOSIS — G4733 Obstructive sleep apnea (adult) (pediatric): Secondary | ICD-10-CM | POA: Insufficient documentation

## 2017-11-13 DIAGNOSIS — Z6829 Body mass index (BMI) 29.0-29.9, adult: Secondary | ICD-10-CM | POA: Insufficient documentation

## 2017-11-13 DIAGNOSIS — R7303 Prediabetes: Secondary | ICD-10-CM | POA: Diagnosis not present

## 2017-11-13 DIAGNOSIS — I428 Other cardiomyopathies: Secondary | ICD-10-CM | POA: Insufficient documentation

## 2017-11-13 DIAGNOSIS — Z9889 Other specified postprocedural states: Secondary | ICD-10-CM | POA: Diagnosis not present

## 2017-11-13 DIAGNOSIS — Z7901 Long term (current) use of anticoagulants: Secondary | ICD-10-CM | POA: Diagnosis not present

## 2017-11-13 DIAGNOSIS — J449 Chronic obstructive pulmonary disease, unspecified: Secondary | ICD-10-CM | POA: Diagnosis not present

## 2017-11-13 DIAGNOSIS — I481 Persistent atrial fibrillation: Secondary | ICD-10-CM | POA: Insufficient documentation

## 2017-11-13 DIAGNOSIS — I110 Hypertensive heart disease with heart failure: Secondary | ICD-10-CM | POA: Insufficient documentation

## 2017-11-13 HISTORY — PX: ATRIAL FIBRILLATION ABLATION: EP1191

## 2017-11-13 LAB — POCT ACTIVATED CLOTTING TIME
ACTIVATED CLOTTING TIME: 175 s
Activated Clotting Time: 257 seconds
Activated Clotting Time: 318 seconds

## 2017-11-13 SURGERY — ATRIAL FIBRILLATION ABLATION
Anesthesia: General

## 2017-11-13 MED ORDER — ACETAMINOPHEN 325 MG PO TABS: 650.0000 mg | ORAL_TABLET | ORAL | Status: DC | PRN

## 2017-11-13 MED ORDER — DOBUTAMINE IN D5W 4-5 MG/ML-% IV SOLN
INTRAVENOUS | Status: DC | PRN
  Administered 2017-11-13: 20 ug/kg/min via INTRAVENOUS

## 2017-11-13 MED ORDER — HEPARIN SODIUM (PORCINE) 1000 UNIT/ML IJ SOLN
INTRAMUSCULAR | Status: AC
  Filled 2017-11-13: qty 1

## 2017-11-13 MED ORDER — HEPARIN (PORCINE) IN NACL 1000-0.9 UT/500ML-% IV SOLN
INTRAVENOUS | Status: DC | PRN
  Administered 2017-11-13 (×5): 500 mL

## 2017-11-13 MED ORDER — MIDAZOLAM HCL 5 MG/5ML IJ SOLN
INTRAMUSCULAR | Status: DC | PRN
  Administered 2017-11-13: 2 mg via INTRAVENOUS

## 2017-11-13 MED ORDER — ATORVASTATIN CALCIUM 20 MG PO TABS
20.0000 mg | ORAL_TABLET | Freq: Every day | ORAL | Status: DC
  Administered 2017-11-13 – 2017-11-14 (×2): 20 mg via ORAL
  Filled 2017-11-13 (×2): qty 1

## 2017-11-13 MED ORDER — PROPOFOL 10 MG/ML IV BOLUS
INTRAVENOUS | Status: DC | PRN
  Administered 2017-11-13 (×2): 100 mg via INTRAVENOUS

## 2017-11-13 MED ORDER — SODIUM CHLORIDE 0.9 % IV SOLN: 250.0000 mL | INTRAVENOUS | Status: DC | PRN

## 2017-11-13 MED ORDER — SODIUM CHLORIDE 0.9% FLUSH
3.0000 mL | Freq: Two times a day (BID) | INTRAVENOUS | Status: DC
  Administered 2017-11-13 (×2): 3 mL via INTRAVENOUS

## 2017-11-13 MED ORDER — SODIUM CHLORIDE 0.9% FLUSH: 3.0000 mL | INTRAVENOUS | Status: DC | PRN

## 2017-11-13 MED ORDER — HEPARIN SODIUM (PORCINE) 1000 UNIT/ML IJ SOLN
INTRAMUSCULAR | Status: DC | PRN
  Administered 2017-11-13: 8000 [IU] via INTRAVENOUS
  Administered 2017-11-13: 3000 [IU] via INTRAVENOUS
  Administered 2017-11-13: 15000 [IU] via INTRAVENOUS

## 2017-11-13 MED ORDER — METOPROLOL SUCCINATE ER 50 MG PO TB24
50.0000 mg | ORAL_TABLET | Freq: Every day | ORAL | Status: DC
  Administered 2017-11-13 – 2017-11-14 (×2): 50 mg via ORAL
  Filled 2017-11-13 (×2): qty 1

## 2017-11-13 MED ORDER — HEPARIN (PORCINE) IN NACL 1000-0.9 UT/500ML-% IV SOLN
INTRAVENOUS | Status: AC
  Filled 2017-11-13: qty 500

## 2017-11-13 MED ORDER — BUPIVACAINE HCL (PF) 0.25 % IJ SOLN
INTRAMUSCULAR | Status: AC
  Filled 2017-11-13: qty 30

## 2017-11-13 MED ORDER — FLUTICASONE PROPIONATE 50 MCG/ACT NA SUSP
2.0000 | Freq: Every day | NASAL | Status: DC | PRN
  Filled 2017-11-13: qty 16

## 2017-11-13 MED ORDER — FLUTICASONE FUROATE-VILANTEROL 200-25 MCG/INH IN AEPB
1.0000 | INHALATION_SPRAY | Freq: Every day | RESPIRATORY_TRACT | Status: DC
  Administered 2017-11-14: 1 via RESPIRATORY_TRACT
  Filled 2017-11-13: qty 28

## 2017-11-13 MED ORDER — ONDANSETRON HCL 4 MG/2ML IJ SOLN: 4.0000 mg | Freq: Four times a day (QID) | INTRAMUSCULAR | Status: DC | PRN

## 2017-11-13 MED ORDER — LIDOCAINE HCL (CARDIAC) PF 100 MG/5ML IV SOSY
PREFILLED_SYRINGE | INTRAVENOUS | Status: DC | PRN
  Administered 2017-11-13: 60 mg via INTRAVENOUS
  Administered 2017-11-13: 40 mg via INTRAVENOUS

## 2017-11-13 MED ORDER — BUPIVACAINE HCL (PF) 0.25 % IJ SOLN
INTRAMUSCULAR | Status: DC | PRN
  Administered 2017-11-13: 30 mL

## 2017-11-13 MED ORDER — LISINOPRIL 5 MG PO TABS
5.0000 mg | ORAL_TABLET | Freq: Every day | ORAL | Status: DC
  Administered 2017-11-13 – 2017-11-14 (×2): 5 mg via ORAL
  Filled 2017-11-13 (×2): qty 1

## 2017-11-13 MED ORDER — ROCURONIUM BROMIDE 100 MG/10ML IV SOLN
INTRAVENOUS | Status: DC | PRN
  Administered 2017-11-13: 50 mg via INTRAVENOUS

## 2017-11-13 MED ORDER — PHENYLEPHRINE HCL 10 MG/ML IJ SOLN
INTRAMUSCULAR | Status: DC | PRN
  Administered 2017-11-13: 40 ug via INTRAVENOUS
  Administered 2017-11-13: 80 ug via INTRAVENOUS
  Administered 2017-11-13: 40 ug via INTRAVENOUS

## 2017-11-13 MED ORDER — SUGAMMADEX SODIUM 200 MG/2ML IV SOLN
INTRAVENOUS | Status: DC | PRN
  Administered 2017-11-13: 250 mg via INTRAVENOUS

## 2017-11-13 MED ORDER — AMIODARONE HCL 200 MG PO TABS
400.0000 mg | ORAL_TABLET | Freq: Every day | ORAL | Status: DC
  Administered 2017-11-13 – 2017-11-14 (×2): 400 mg via ORAL
  Filled 2017-11-13 (×2): qty 2

## 2017-11-13 MED ORDER — DOBUTAMINE IN D5W 4-5 MG/ML-% IV SOLN
INTRAVENOUS | Status: AC
  Filled 2017-11-13: qty 250

## 2017-11-13 MED ORDER — RIVAROXABAN 20 MG PO TABS
20.0000 mg | ORAL_TABLET | Freq: Every day | ORAL | Status: DC
  Administered 2017-11-13: 20 mg via ORAL
  Filled 2017-11-13: qty 1

## 2017-11-13 MED ORDER — PROTAMINE SULFATE 10 MG/ML IV SOLN
INTRAVENOUS | Status: DC | PRN
  Administered 2017-11-13: 20 mg via INTRAVENOUS
  Administered 2017-11-13: 30 mg via INTRAVENOUS

## 2017-11-13 MED ORDER — FENTANYL CITRATE (PF) 100 MCG/2ML IJ SOLN
INTRAMUSCULAR | Status: DC | PRN
  Administered 2017-11-13: 100 ug via INTRAVENOUS
  Administered 2017-11-13: 50 ug via INTRAVENOUS

## 2017-11-13 MED ORDER — HEPARIN SODIUM (PORCINE) 1000 UNIT/ML IJ SOLN
INTRAMUSCULAR | Status: DC | PRN
  Administered 2017-11-13: 1000 [IU] via INTRAVENOUS

## 2017-11-13 MED ORDER — FUROSEMIDE 20 MG PO TABS
20.0000 mg | ORAL_TABLET | Freq: Every day | ORAL | Status: DC
  Administered 2017-11-13 – 2017-11-14 (×2): 20 mg via ORAL
  Filled 2017-11-13 (×2): qty 1

## 2017-11-13 MED ORDER — SODIUM CHLORIDE 0.9 % IV SOLN
INTRAVENOUS | Status: DC
  Administered 2017-11-13 (×3): via INTRAVENOUS

## 2017-11-13 MED ORDER — ONDANSETRON HCL 4 MG/2ML IJ SOLN
INTRAMUSCULAR | Status: DC | PRN
  Administered 2017-11-13: 4 mg via INTRAVENOUS

## 2017-11-13 MED ORDER — EPHEDRINE SULFATE 50 MG/ML IJ SOLN
INTRAMUSCULAR | Status: DC | PRN
  Administered 2017-11-13: 10 mg via INTRAVENOUS
  Administered 2017-11-13: 5 mg via INTRAVENOUS
  Administered 2017-11-13: 10 mg via INTRAVENOUS

## 2017-11-13 SURGICAL SUPPLY — 19 items
BLANKET WARM UNDERBOD FULL ACC (MISCELLANEOUS) ×2 IMPLANT
CATH MAPPNG PENTARAY F 2-6-2MM (CATHETERS) ×1 IMPLANT
CATH SMTCH THERMOCOOL SF DF (CATHETERS) ×2 IMPLANT
CATH SOUNDSTAR 3D IMAGING (CATHETERS) ×2 IMPLANT
CATH WEBSTER BI DIR CS D-F CRV (CATHETERS) ×2 IMPLANT
COVER SWIFTLINK CONNECTOR (BAG) ×2 IMPLANT
PACK EP LATEX FREE (CUSTOM PROCEDURE TRAY) ×1
PACK EP LF (CUSTOM PROCEDURE TRAY) ×1 IMPLANT
PAD PRO RADIOLUCENT 2001M-C (PAD) ×2 IMPLANT
PATCH CARTO3 (PAD) ×2 IMPLANT
PENTARAY F 2-6-2MM (CATHETERS) ×2
SHEATH AVANTI 11F 11CM (SHEATH) ×2 IMPLANT
SHEATH BAYLIS SUREFLEX  M 8.5 (SHEATH) ×2
SHEATH BAYLIS SUREFLEX M 8.5 (SHEATH) ×2 IMPLANT
SHEATH BAYLIS TRANSSEPTAL 98CM (NEEDLE) ×2 IMPLANT
SHEATH PINNACLE 7F 10CM (SHEATH) ×2 IMPLANT
SHEATH PINNACLE 8F 10CM (SHEATH) ×4 IMPLANT
SHEATH PINNACLE 9F 10CM (SHEATH) ×4 IMPLANT
TUBING SMART ABLATE COOLFLOW (TUBING) ×2 IMPLANT

## 2017-11-13 NOTE — Transfer of Care (Signed)
Immediate Anesthesia Transfer of Care Note  Patient: Christian Ortiz  Procedure(s) Performed: ATRIAL FIBRILLATION ABLATION (N/A )  Patient Location: Cath Lab  Anesthesia Type:General  Level of Consciousness: awake, alert  and oriented  Airway & Oxygen Therapy: Patient Spontanous Breathing and Patient connected to nasal cannula oxygen  Post-op Assessment: Report given to RN, Post -op Vital signs reviewed and stable and Patient moving all extremities  Post vital signs: Reviewed and stable  Last Vitals:  Vitals Value Taken Time  BP    Temp 36.4 C 11/13/2017  1:37 PM  Pulse 70 11/13/2017  1:39 PM  Resp 20 11/13/2017  1:39 PM  SpO2 92 % 11/13/2017  1:39 PM  Vitals shown include unvalidated device data.  Last Pain:  Vitals:   11/13/17 1337  TempSrc: Temporal  PainSc: 0-No pain      Patients Stated Pain Goal: 2 (94/44/61 9012)  Complications: No apparent anesthesia complications

## 2017-11-13 NOTE — Progress Notes (Signed)
Site area: RFV x 2   LFV x 2 Site Prior to Removal:  Level 0/0 Pressure Applied For:20 min each Manual:  yes Patient Status During Pull:  stable Post Pull Site:  Level 0/0 Post Pull Instructions Given:  yes Post Pull Pulses Present: palpable Dressing Applied:  clear Bedrest begins @ 4944 till 2045 Comments:

## 2017-11-13 NOTE — Discharge Summary (Addendum)
ELECTROPHYSIOLOGY PROCEDURE DISCHARGE SUMMARY    Patient ID: Christian Ortiz,  MRN: [id number], DOB/AGE: [id number] 65 y.o.  Admit date: 11/13/2017 Discharge date: 11/14/2017  Primary Care Physician: Gregor Hams, MD Electrophysiologist: [facility]  Primary Discharge Diagnosis:  Persistent atrial fibrillation status post ablation this admission  Secondary Discharge Diagnosis:  1.  Obesity 2.  OSA 3.  COPD 4.  HTN 5.  NICM 6.  Moderate to severe MR  Procedures This Admission:  1.  Electrophysiology study and radiofrequency catheter ablation on 11/13/17 by Dr Curt Bears.  See op note for full details. There were no early apparent complications.  Brief HPI: Christian Ortiz is a 67 y.o. male with a history of persistent atrial fibrillation.  They have failed medical therapy with amiodarone. Risks, benefits, and alternatives to catheter ablation of atrial fibrillation were reviewed with the patient who wished to proceed.  The patient underwent cardiac CT prior to the procedure which demonstrated no LAA thrombus.    Hospital Course:  The patient was admitted and underwent EPS/RFCA of atrial fibrillation with details as outlined above.  He was monitored on telemetry overnight which demonstrated SB 50's.  Groin sites are without complication on the day of discharge.  The patient feels well this morning, no CP or SOB, he was was examined by Dr. Curt Bears and considered to be stable for discharge.  Wound care and restrictions were reviewed with the patient.  The patient Christian Ortiz be seen back by Roderic Palau, NP in 4 weeks and Dr Curt Bears in 12 weeks for post ablation follow up.   This patients CHA2DS2-VASc Score and unadjusted Ischemic Stroke Rate (% per year) is equal to 2.2 % stroke rate/year from a score of 2 on Xarelto Above score calculated as 1 point each if present [CHF, HTN, DM, Vascular=MI/PAD/Aortic Plaque, Age if 65-74, or Male] Above score calculated as 2 points each if present [Age  > 75, or Stroke/TIA/TE]   Physical Exam: Vitals:   11/13/17 1759 11/13/17 1828 11/13/17 2118 11/14/17 0612  BP: 117/68 116/68 124/66 122/78  Pulse:   (!) 59 (!) 56  Resp: (!) 22 15    Temp:   97.6 F (36.4 C) 98.4 F (36.9 C)  TempSrc:    Oral  SpO2:   96% 95%  Weight:    100.2 kg  Height:        GEN- The patient is well appearing, alert and oriented x 3 today.   HEENT: normocephalic, atraumatic; sclera clear, conjunctiva pink; hearing intact; oropharynx clear; neck supple  Lungs- CTA b/l, normal work of breathing.  No wheezes, rales, rhonchi Heart-RRR, no murmurs, rubs or gallops  GI- soft, non-tender, non-distended  Extremities- no clubbing, cyanosis, or edema; DP/PT 2+ bilaterally, b/l groin without hematoma/bruit MS- no significant deformity or atrophy Skin- warm and dry, no rash or lesion Psych- euthymic mood, full affect Neuro- strength and sensation are intact   Labs:   Lab Results  Component Value Date   WBC 8.1 11/10/2017   HGB 13.3 11/10/2017   HCT 40.2 11/10/2017   MCV 93 11/10/2017   PLT 285 11/10/2017    Recent Labs  Lab 11/10/17 1432  NA 141  K 4.5  CL 103  CO2 23  BUN 21  CREATININE 1.12  CALCIUM 9.3  GLUCOSE 89     Discharge Medications:  Allergies as of 11/14/2017   No Known Allergies     Medication List    TAKE these medications   amiodarone 200  MG tablet Commonly known as:  PACERONE Take 1 tablet (200 mg total) by mouth daily. What changed:    medication strength  how much to take   atorvastatin 80 MG tablet Commonly known as:  LIPITOR Take 1 tablet (80 mg total) by mouth daily. What changed:    medication strength  how much to take   diclofenac sodium 1 % Gel Commonly known as:  VOLTAREN Apply 2 g topically 4 (four) times daily. To affected joint. What changed:    when to take this  reasons to take this  additional instructions   fluticasone 50 MCG/ACT nasal spray Commonly known as:  FLONASE Place 2 sprays  into both nostrils daily. APPOINTMENT NEEDED FOR FURTHER REFILLS What changed:    when to take this  reasons to take this   Fluticasone-Salmeterol 250-50 MCG/DOSE Aepb Commonly known as:  ADVAIR Inhale 1 puff into the lungs 2 (two) times daily.   furosemide 20 MG tablet Commonly known as:  LASIX Take 20 mg by mouth daily.   lisinopril 5 MG tablet Commonly known as:  PRINIVIL,ZESTRIL Take 5 mg by mouth daily.   metoprolol succinate 50 MG 24 hr tablet Commonly known as:  TOPROL-XL Take 50 mg by mouth daily. Take with or immediately following a meal.   rivaroxaban 20 MG Tabs tablet Commonly known as:  XARELTO Take 20 mg by mouth daily.       Disposition:  Discharge Instructions    Diet - low sodium heart healthy   Complete by:  As directed    Increase activity slowly   Complete by:  As directed      Follow-up Information    [facility] Follow up on 12/09/2017.   Specialty:  Cardiology Why:  at [facility] information: [address] [id number] [place] G[place] [address] [phone]       Constance Haw, MD Follow up on 02/10/2018.   Specialty:  Cardiology Why:  at [address]Noon Contact information: [place] [address] [phone]           Duration of Discharge Encounter: Greater than 30 minutes including physician time.  Signed, Chanetta Marshall, NP 11/14/2017 8:52 AM   I have seen and examined this patient with Tommye Standard.  Agree with above, note added to reflect my findings.  On exam, RRR, no murmurs, lungs clear.  Patient had AF ablation for paroxysmal atrial fibrillation. Tolerated the procedure well without complaint this morning. Plan for discharge today with follow up in EP clinic.   Calvyn Kurtzman M. Ryland Tungate MD 11/14/2017 11:43 AM

## 2017-11-13 NOTE — Progress Notes (Signed)
Site area:  Site Prior to Removal:  Level  Pressure Applied For: Manual:    Patient Status During Pull:   Post Pull Site:  Level Post Pull Instructions Given:   Post Pull Pulses Present:  Dressing Applied:   Bedrest begins @  Comments:   

## 2017-11-13 NOTE — Anesthesia Postprocedure Evaluation (Signed)
Anesthesia Post Note  Patient: Christian Ortiz  Procedure(s) Performed: ATRIAL FIBRILLATION ABLATION (N/A )     Patient location during evaluation: PACU Anesthesia Type: General Level of consciousness: awake and alert Pain management: pain level controlled Vital Signs Assessment: post-procedure vital signs reviewed and stable Respiratory status: spontaneous breathing, nonlabored ventilation, respiratory function stable and patient connected to nasal cannula oxygen Cardiovascular status: blood pressure returned to baseline and stable Postop Assessment: no apparent nausea or vomiting Anesthetic complications: no    Last Vitals:  Vitals:   11/13/17 1401 11/13/17 1405  BP: 134/72 136/71  Pulse: 61 62  Resp: 18 18  Temp:    SpO2: 93% 93%    Last Pain:  Vitals:   11/13/17 1337  TempSrc: Temporal  PainSc: 0-No pain                 Joice Nazario S

## 2017-11-13 NOTE — Anesthesia Preprocedure Evaluation (Signed)
Anesthesia Evaluation  Patient identified by MRN, date of birth, ID band Patient awake    Reviewed: Allergy & Precautions, H&P , NPO status , Patient's Chart, lab work & pertinent test results, reviewed documented beta blocker date and time   Airway Mallampati: II  TM Distance: >3 FB Neck ROM: Full    Dental no notable dental hx. (+) Teeth Intact, Dental Advisory Given   Pulmonary sleep apnea , COPD,  COPD inhaler,    Pulmonary exam normal breath sounds clear to auscultation       Cardiovascular hypertension, Pt. on medications and Pt. on home beta blockers + dysrhythmias Atrial Fibrillation  Rhythm:Irregular Rate:Normal     Neuro/Psych negative neurological ROS  negative psych ROS   GI/Hepatic negative GI ROS, Neg liver ROS,   Endo/Other  negative endocrine ROS  Renal/GU negative Renal ROS  negative genitourinary   Musculoskeletal   Abdominal   Peds  Hematology negative hematology ROS (+)   Anesthesia Other Findings   Reproductive/Obstetrics negative OB ROS                             Anesthesia Physical Anesthesia Plan  ASA: III  Anesthesia Plan: General   Post-op Pain Management:    Induction: Intravenous  PONV Risk Score and Plan: 3 and Ondansetron, Dexamethasone and Midazolam  Airway Management Planned: Oral ETT  Additional Equipment:   Intra-op Plan:   Post-operative Plan: Extubation in OR  Informed Consent: I have reviewed the patients History and Physical, chart, labs and discussed the procedure including the risks, benefits and alternatives for the proposed anesthesia with the patient or authorized representative who has indicated his/her understanding and acceptance.   Dental advisory given  Plan Discussed with: CRNA  Anesthesia Plan Comments:         Anesthesia Quick Evaluation

## 2017-11-13 NOTE — Anesthesia Procedure Notes (Signed)
Procedure Name: Intubation Date/Time: 11/13/2017 10:36 AM Performed by: Abdur Hoglund T, CRNA Pre-anesthesia Checklist: Patient identified, Suction available, Emergency Drugs available and Patient being monitored Patient Re-evaluated:Patient Re-evaluated prior to induction Oxygen Delivery Method: Circle system utilized Preoxygenation: Pre-oxygenation with 100% oxygen Induction Type: IV induction Ventilation: Mask ventilation without difficulty Laryngoscope Size: Miller and 3 Grade View: Grade I Tube type: Oral Tube size: 7.5 mm Number of attempts: 1 Airway Equipment and Method: Patient positioned with wedge pillow and Stylet Placement Confirmation: ETT inserted through vocal cords under direct vision,  positive ETCO2 and breath sounds checked- equal and bilateral Secured at: 22 cm Tube secured with: Tape Dental Injury: Teeth and Oropharynx as per pre-operative assessment

## 2017-11-13 NOTE — H&P (Signed)
Christian Ortiz has presented today for surgery, with the diagnosis of atrial fibrillation.  The various methods of treatment have been discussed with the patient and family. After consideration of risks, benefits and other options for treatment, the patient has consented to  Procedure(s): Catheter ablation as a surgical intervention .  Risks include but not limited to bleeding, tamponade, heart block, stroke, damage to surrounding organs, among others. The patient's history has been reviewed, patient examined, no change in status, stable for surgery.  I have reviewed the patient's chart and labs.  Questions were answered to the patient's satisfaction.    Jameson Tormey Curt Bears, MD 11/13/2017 9:26 AM

## 2017-11-14 ENCOUNTER — Telehealth: Payer: Self-pay | Admitting: Family Medicine

## 2017-11-14 ENCOUNTER — Encounter (HOSPITAL_COMMUNITY): Payer: Self-pay | Admitting: Cardiology

## 2017-11-14 DIAGNOSIS — I10 Essential (primary) hypertension: Secondary | ICD-10-CM

## 2017-11-14 DIAGNOSIS — E782 Mixed hyperlipidemia: Secondary | ICD-10-CM

## 2017-11-14 DIAGNOSIS — I482 Chronic atrial fibrillation, unspecified: Secondary | ICD-10-CM

## 2017-11-14 DIAGNOSIS — R399 Unspecified symptoms and signs involving the genitourinary system: Secondary | ICD-10-CM

## 2017-11-14 DIAGNOSIS — I481 Persistent atrial fibrillation: Secondary | ICD-10-CM | POA: Diagnosis not present

## 2017-11-14 DIAGNOSIS — R7303 Prediabetes: Secondary | ICD-10-CM

## 2017-11-14 DIAGNOSIS — R972 Elevated prostate specific antigen [PSA]: Secondary | ICD-10-CM

## 2017-11-14 DIAGNOSIS — I5021 Acute systolic (congestive) heart failure: Secondary | ICD-10-CM

## 2017-11-14 DIAGNOSIS — J441 Chronic obstructive pulmonary disease with (acute) exacerbation: Secondary | ICD-10-CM

## 2017-11-14 MED ORDER — ATORVASTATIN CALCIUM 80 MG PO TABS: 80.0000 mg | ORAL_TABLET | Freq: Every day | ORAL | 6 refills | Status: DC

## 2017-11-14 MED ORDER — AMIODARONE HCL 200 MG PO TABS: 200.0000 mg | ORAL_TABLET | Freq: Every day | ORAL | 6 refills | Status: DC

## 2017-11-14 NOTE — Telephone Encounter (Signed)
-----   Message from [name], MD sent at 11/10/2017  8:39 AM EDT ----- High calcium score. Needs fasting lipids. CT with out LAA thrombus reviewed.

## 2017-11-14 NOTE — Telephone Encounter (Signed)
Left a message on patient vm to call the office back for message from Dr. Georgina Snell. Marcea Rojek,CMA

## 2017-11-14 NOTE — Telephone Encounter (Signed)
I received a message from cardiology saying that you had a high calcium score on a CT scan of the chest indicating concern for coronary artery disease. Cardiology recommends getting fasting cholesterol labs.  I have ordered these labs.  You are also due for checking some other labs which have ordered as well.  Please get labs done fasting in the near future.   You are also very overdue for follow-up please schedule a follow-up appointment with me for the near future.

## 2017-11-14 NOTE — Discharge Instructions (Signed)
Post procedure care instructions No driving for 4 days. No lifting over 5 lbs for 1 week. No vigorous or sexual activity for 1 week. You may return to work in 1 week. Keep procedure site clean & dry. If you notice increased pain, swelling, bleeding or pus, call/return!  You may shower, but no soaking baths/hot tubs/pools for 1 week.   You have an appointment set up with the [facility].  Multiple studies have shown that being followed by a dedicated atrial fibrillation clinic in addition to the standard care you receive from your other physicians improves health. We believe that enrollment in the atrial fibrillation clinic will allow [place] to better care for you.   The phone number to the [facility] is [phone]. The clinic is staffed Monday through Friday from 8:30am to 5pm.  Parking Directions: The clinic is located in the Heart and Vascular Building connected to [facility]. 1)From [address] turn on to [address] and go to the 3rd entrance  (Heart and Vascular entrance) on the right. 2)Look to the right for Heart &Vascular Parking Garage. 3)A code for the entrance is required please call the clinic to receive this.   4)Take the elevators to the 1st floor. Registration is in the room with the glass walls at the end of the hallway.  If you have any trouble parking or locating the clinic, please dont hesitate to call [phone].

## 2017-11-19 ENCOUNTER — Encounter: Payer: Self-pay | Admitting: Family Medicine

## 2017-11-19 ENCOUNTER — Ambulatory Visit (INDEPENDENT_AMBULATORY_CARE_PROVIDER_SITE_OTHER): Payer: Managed Care, Other (non HMO) | Admitting: Family Medicine

## 2017-11-19 VITALS — BP 129/72 | HR 65 | Ht 72.0 in | Wt 224.0 lb

## 2017-11-19 DIAGNOSIS — I340 Nonrheumatic mitral (valve) insufficiency: Secondary | ICD-10-CM

## 2017-11-19 DIAGNOSIS — I5021 Acute systolic (congestive) heart failure: Secondary | ICD-10-CM | POA: Diagnosis not present

## 2017-11-19 DIAGNOSIS — Z23 Encounter for immunization: Secondary | ICD-10-CM | POA: Diagnosis not present

## 2017-11-19 DIAGNOSIS — I428 Other cardiomyopathies: Secondary | ICD-10-CM | POA: Diagnosis not present

## 2017-11-19 DIAGNOSIS — I482 Chronic atrial fibrillation, unspecified: Secondary | ICD-10-CM

## 2017-11-19 MED ORDER — FUROSEMIDE 40 MG PO TABS: 40.0000 mg | ORAL_TABLET | Freq: Every day | ORAL | 1 refills | Status: DC

## 2017-11-19 NOTE — Progress Notes (Signed)
Christian Ortiz is a 67 y.o. male who presents to [facility] today for follow-up recent hospitalization.  [name] has a recent history of cardiology concerns.  He has a history of mitral regurgitation and nonischemic cardiomyopathy with an ejection fraction of 40 to 45%.  Additionally this was compounded by paroxysmal atrial fibrillation.  He developed persistent atrial fibrillation and recently had an ablation performed on September 19.  This is performed by Dr. Curt Bears.  He is feeling much better now and notes improved exertional capacity.  He notes that his cardiology care was previously at [facility] but he like to switch to [facility] as he had a good experience.   Since his discharge he is done well but notes a 4 pound weight gain in the bit of leg swelling.  He takes 20 mg of Lasix daily for edema.  He notes that he does not get much diuresis with the 20 mg dose.  He denies significant shortness of breath orthopnea.  He is interested in returning to work.  He works at [employer] and would like to go back to work on Monday if possible.   ROS as above:  Exam:  BP 129/72   Pulse 65   Ht 5\' 6"  (1.676 m)   Wt 224 lb (101.6 kg)   BMI 36.15 kg/m  Wt Readings from Last 5 Encounters:  11/19/17 224 lb (101.6 kg)  11/14/17 220 lb 14.4 oz (100.2 kg)  10/22/17 220 lb (99.8 kg)  07/22/17 221 lb (100.2 kg)  02/04/17 235 lb (106.6 kg)    Gen: Well NAD HEENT: EOMI,  MMM Lungs: Normal work of breathing. CTABL Heart: RRR no MRG Abd: NABS, Soft. Nondistended, Nontender Exts: Brisk capillary refill, warm and well perfused.  Trace bilateral lower extremities shins present.  Lab and Radiology Results [facility] [address] +------------------------------------+ [place], ::[place] [address] :: [phone] :: Fax [phone] +------------------------------------+ Transesophageal  Echocardiogram  Report  +--------------------------------------------------------------------------+ :Name: Christian Ortiz Study Date: 07/24/2017 07:02 AM: :Patient Location: [place] PERIOP^PERIOPBP: 127/77 mmHg: :MRN: [id number] : :DOB: 1952/05/16Height: 72 in: :Gender: Male Weight: 221 lb : :Race: WHITE,White or Caucasian: :Age: 57 yrsBSA: 2.2 m2: :Reason For Study: ^AFib Ablation: :History: CHF,COPD,Hypertension,Atrial Fibrillation,Obstructive Sleep Apnea: :Referring Physician: Radiontchenko, Alexei: :Performed By: Starlyn Skeans, BS,RDCS,RCS: +--------------------------------------------------------------------------+  Interpretation Summary A complete 2D transesophageal echocardiogram with color flow Doppler and spectral Doppler was performed. When the patient was appropriately sedated, the Transesophageal probe was passed to the distal esophageal without difficulty. The study was technically adequate. The left ventricle is normal in size. There is mild concentric left ventricular hypertrophy. There is mild diffuse hypokinesis of the left ventricle. The left  ventricular ejection fraction is moderately reduced (40-45%). The left atrium is moderately dilated. There is no thrombus seen in the appendage. There is no Doppler evidence for an atrial septal defect. The right atrium is mildly dilated. There is moderate to moderate-severe mitral regurgitation (2-3+). There is mild tricuspid regurgitation (1+).  Left Ventricle The left ventricle is normal in size. There is mild concentric left ventricular hypertrophy. There is mild diffuse hypokinesis of the left ventricle. The left ventricular ejection fraction is moderately reduced (40-45%).   Right Ventricle The right ventricle is normal in structure and function.  Atria The left atrium is moderately dilated. There is no thrombus seen in the appendage. There is no Doppler evidence for an atrial septal defect.  The right atrium is mildly dilated.  Mitral Valve The mitral valve is mildly thickened. There is moderate to moderate-severe mitral regurgitation (2-3+).   Tricuspid Valve The tricuspid valve leaflets are thin and pliable and the valve motion is normal. There is mild tricuspid regurgitation (1+).  Aortic Valve The aortic valve is tri-leaflet with thin, pliable leaflets that move normally.  Pulmonic Valve The pulmonic valve is normal in structure and function.  Vessels The aortic root is normal. The descending aorta is normal.  Pericardium There is no pericardial effusion.  Doppler Measurements & Calculations MR max vel: 440.7 cm/sec MR max PG: 77.7 mmHgMR ERO: 0.38 cm2 MR PISA radius: 0.84 cm  Disposition/Recommendations The results of this echocardiogram have been personally reviewed with the patient. ____________________________________________________________________________     +----------------------------------------------------- -------------------+   :   : Electronically signed by:+----------------------------------------------------- -------------------+  Lamar Blinks, M.D.,F.A.C.C.on 07/24/2017 09:16 AM Ordering Physician: [id number]^[id number]^S^^^^^WN Referring Physician: Jamesetta Geralds Performed By: Starlyn Skeans, BS,RDCS,RCS   Assessment and Plan: 67 y.o. male with  Atrial fibrillation status post ablation: Doing well.  Continue current medication and continue to follow-up with electrophysiology cardiology as arranged.  Okay to return to work if doing well.  We will continue to provide anticoagulation as well.  Heart failure: EF 40% with some leg swelling or recent weight gain.  I do not think the 20 mg dose of Lasix is enough.  Plan increase Lasix to 40 mg and recheck in a month.  At that visit likely will recheck metabolic panel.  Patient is transitioning his primary cardiology care to [facility] from [place].  Plan to refer to general cardiology to have additional help in coverage with this issue.  Mitral valve regurgitation: Moderate likely a component of the underlying heart failure.  Patient in the future may benefit from valve surgery as the valvular heart disease may be 1 of the underlying issues resulting in his heart failure.  Flu vaccine given today  Recheck 1 month  Orders Placed This Encounter  Procedures  . Flu Vaccine QUAD 6+ mos PF IM (Fluarix Quad PF)   No orders of the defined types were placed in this encounter.    Historical information moved to improve visibility of documentation.  Past Medical History:  Diagnosis Date  . Carpal tunnel syndrome    Bilateral  . Chronic atrial fibrillation ([place]) 10/01/2017  . Chronic obstructive pulmonary disease ([place]) 01/13/2014   Overview:  Mild.  [facility] is following.  . Chronic systolic heart failure ([place])   . Elevated PSA 09/21/2015  . Essential hypertension  04/25/2014  . Hypertension, essential 10/01/2017  . Non-rheumatic mitral regurgitation 07/23/2017  . Noncompliance 08/01/2017  . Nonischemic cardiomyopathy ([place]) 07/23/2017  . Nonrheumatic mitral valve regurgitation 10/01/2017  . Obesity (BMI 30-39.9) 02/15/2015  . OSA (obstructive sleep apnea) 05/10/2014  . Persistent atrial fibrillation ([place])   . Prediabetes 09/27/2015  . S/P laparoscopic appendectomy 02/15/2013  . Sleep apnea                          DECIDED AGAINST CPAP 10/01/2017  . Ulnar neuropathy    Past Surgical History:  Procedure Laterality Date  . APPENDECTOMY  02/15/2013  . ATRIAL FIBRILLATION ABLATION N/A 11/13/2017   Procedure: ATRIAL FIBRILLATION ABLATION;  Surgeon: Constance Haw, MD;  Location: [facility];  Service: Cardiovascular;  Laterality: N/A;  . sessile colonic polyp N/A 02/15/2013   Social History   Tobacco Use  . Smoking  status: Never Smoker  . Smokeless tobacco: Never Used  Substance Use Topics  . Alcohol use: No   family history includes COPD in his father; Cancer in his mother; Diabetes in his father; Hypertension in his father.  Medications: Current Outpatient Medications  Medication Sig Dispense Refill  . amiodarone (PACERONE) 200 MG tablet Take 1 tablet (200 mg total) by mouth daily. 30 tablet 6  . atorvastatin (LIPITOR) 80 MG tablet Take 1 tablet (80 mg total) by mouth daily. 30 tablet 6  . diclofenac sodium (VOLTAREN) 1 % GEL Apply 2 g topically 4 (four) times daily. To affected joint. (Patient taking differently: Apply 2 g topically daily as needed (joint pain). ) 100 g 11  . fluticasone (FLONASE) 50 MCG/ACT nasal spray Place 2 sprays into both nostrils daily. APPOINTMENT NEEDED FOR FURTHER REFILLS (Patient taking differently: Place 2 sprays into both nostrils daily as needed for allergies. APPOINTMENT NEEDED FOR FURTHER REFILLS) 16 g 0  . Fluticasone-Salmeterol (ADVAIR) 250-50 MCG/DOSE AEPB Inhale 1 puff into the lungs 2 (two) times  daily.    . furosemide (LASIX) 20 MG tablet Take 20 mg by mouth daily.    Marland Kitchen lisinopril (PRINIVIL,ZESTRIL) 5 MG tablet Take 5 mg by mouth daily.    . metoprolol succinate (TOPROL-XL) 50 MG 24 hr tablet Take 50 mg by mouth daily. Take with or immediately following a meal.    . rivaroxaban (XARELTO) 20 MG TABS tablet Take 20 mg by mouth daily.     No current facility-administered medications for this visit.    No Known Allergies   Discussed warning signs or symptoms. Please see discharge instructions. Patient expresses understanding.

## 2017-11-19 NOTE — Telephone Encounter (Signed)
Patient was given results and patient has an appointment today to follow up. Rhonda Cunningham,CMA

## 2017-11-19 NOTE — Patient Instructions (Addendum)
Thank you for coming in today. Increase Furosemide (Lasix) to 75m daily.  Recheck in 1 month.  You should hear from [place] [facility] soon.   Keep track of weight and fluids.  Let me know if not doing well.    Heart Failure and Exercise Heart failure is a condition in which the heart does not fill or pump enough blood and oxygen to support your body and its functions. Heart failure is a long-term (chronic) condition. Living with heart failure can be challenging. However, following your health care provider's instructions about a healthy lifestyle may help improve your symptoms. This includes choosing the right exercise plan. Doing daily physical activity is important after a diagnosis of heart failure. You may have some activity restrictions, so talk to your health care provider before doing any exercises. What are the benefits of exercise? Exercise may:  Make your heart muscles stronger.  Lower your blood pressure.  Lower your cholesterol.  Help you lose weight.  Help your bones stay strong.  Improve your blood circulation.  Help your body use oxygen better. This relieves symptoms such as fatigue and shortness of breath.  Help your mental health by lowering the risk of depression and other problems.  Improve your quality of life.  Decrease your chance of hospital admission for heart failure.  What is an exercise plan? An exercise plan is a set of specific exercises and training activities. You will work with your health care provider to create the exercise plan that works for you. The plan may include:  Different types of exercises and how to do them.  Cardiac rehabilitation exercises. These are supervised programs that are designed to strengthen your heart.  What are strengthening exercises? Strengthening exercises are a type of physical activity that involves using resistance to improve your muscle strength. Strengthening exercises usually have repetitive motions.  These types of exercises can include:  Lifting weights.  Using weight machines.  Using resistance tubes and bands.  Using kettlebells.  Using your body weight, such as doing push-ups or squats.  What are balance exercises? Balance exercises are another type of physical activity. They strengthen the muscles of the back, stomach, and pelvis (core muscles) and improve your balance. They can also lower your risk of falling. These types of exercises can include:  Standing on one leg.  Walking backward, sideways, and in a straight line.  Standing up after sitting, without using your hands.  Shifting your weight from one leg to the other.  Lifting one leg in front of you.  Doing tai chi. This is a type of exercise that uses slow movements and deep breathing.  How can I increase my flexibility? Having better flexibility can keep you from falling. It can also lengthen your muscles, improve your range of motion, and help your joints. You can increase your flexibility by:  Doing tai chi.  Doing yoga.  Stretching.  How much aerobic exercise should I get? Aerobic exercises strengthen your breathing and circulation system and increase your body's use of oxygen. Examples of aerobic exercise include biking, walking, running, and swimming. Talk to your health care provider to find out how much aerobic exercise is safe for you.  To do these exercises:  Start exercising slowly, limiting the amount of time at first. You may need to start with 5 minutes of aerobic exercise every day.  Slowly add more minutes until you can safely do at least 30 minutes of exercise at least 4 days a week.  Summary  Daily physical activity is important after a diagnosis of heart failure.  Exercise can make your heart muscles stronger. It also offers other benefits that will improve your health.  Talk to your health care provider before doing any exercises. This information is not intended to replace advice  given to you by your health care provider. Make sure you discuss any questions you have with your health care provider. Document Released: 06/25/2016 Document Revised: 06/25/2016 Document Reviewed: 06/25/2016 Elsevier Interactive Patient Education  2018 [employer].

## 2017-11-21 ENCOUNTER — Telehealth: Payer: Self-pay | Admitting: Cardiology

## 2017-11-21 NOTE — Telephone Encounter (Signed)
New Message   Patient is calling because he received a call that he needed to call to obtain some lab results. Please call.

## 2017-11-21 NOTE — Telephone Encounter (Signed)
Patient calling back. Made patient aware that there is no record of anyone calling or requesting labs from our office. Made patient aware that it looks like his PCP ordered labs. Patient will reach out to PCP.

## 2017-11-21 NOTE — Telephone Encounter (Signed)
Left message for patient to call back  

## 2017-11-27 ENCOUNTER — Telehealth: Payer: Self-pay | Admitting: Family Medicine

## 2017-11-27 NOTE — Telephone Encounter (Signed)
Spoke with Gene's [employer] case IT consultant.  Will send medical records so patient can get access to increased surveillance and extra care.

## 2017-11-28 ENCOUNTER — Ambulatory Visit: Payer: Self-pay | Admitting: Cardiology

## 2017-12-05 ENCOUNTER — Encounter: Payer: Self-pay | Admitting: Family Medicine

## 2017-12-09 ENCOUNTER — Ambulatory Visit (HOSPITAL_COMMUNITY)
Admission: RE | Admit: 2017-12-09 | Discharge: 2017-12-09 | Disposition: A | Discharge status: Home / Self Care | Payer: Managed Care, Other (non HMO) | Source: Ambulatory Visit | Attending: Nurse Practitioner | Admitting: Nurse Practitioner

## 2017-12-09 ENCOUNTER — Encounter (HOSPITAL_COMMUNITY): Payer: Self-pay | Admitting: Nurse Practitioner

## 2017-12-09 VITALS — BP 126/74 | HR 69 | Ht 72.0 in | Wt 223.0 lb

## 2017-12-09 DIAGNOSIS — Z7901 Long term (current) use of anticoagulants: Secondary | ICD-10-CM | POA: Diagnosis not present

## 2017-12-09 DIAGNOSIS — I5022 Chronic systolic (congestive) heart failure: Secondary | ICD-10-CM | POA: Diagnosis not present

## 2017-12-09 DIAGNOSIS — J449 Chronic obstructive pulmonary disease, unspecified: Secondary | ICD-10-CM | POA: Diagnosis not present

## 2017-12-09 DIAGNOSIS — R7303 Prediabetes: Secondary | ICD-10-CM | POA: Insufficient documentation

## 2017-12-09 DIAGNOSIS — I110 Hypertensive heart disease with heart failure: Secondary | ICD-10-CM | POA: Insufficient documentation

## 2017-12-09 DIAGNOSIS — G4733 Obstructive sleep apnea (adult) (pediatric): Secondary | ICD-10-CM | POA: Diagnosis not present

## 2017-12-09 DIAGNOSIS — E669 Obesity, unspecified: Secondary | ICD-10-CM | POA: Diagnosis not present

## 2017-12-09 DIAGNOSIS — I4819 Other persistent atrial fibrillation: Secondary | ICD-10-CM

## 2017-12-09 DIAGNOSIS — I480 Paroxysmal atrial fibrillation: Secondary | ICD-10-CM | POA: Diagnosis not present

## 2017-12-09 DIAGNOSIS — Z79899 Other long term (current) drug therapy: Secondary | ICD-10-CM | POA: Insufficient documentation

## 2017-12-09 DIAGNOSIS — G5600 Carpal tunnel syndrome, unspecified upper limb: Secondary | ICD-10-CM | POA: Insufficient documentation

## 2017-12-09 DIAGNOSIS — I340 Nonrheumatic mitral (valve) insufficiency: Secondary | ICD-10-CM | POA: Diagnosis not present

## 2017-12-09 NOTE — Progress Notes (Signed)
Primary Care Physician: Gregor Hams, MD Referring Physician: Dr. Ventura Bruns Christian Ortiz is a 67 y.o. male with a h/o persistent afib that is in the afib clinic for f/u ablation one month ago. He is doing well. He has not noted any afib since the procedure. He continues on amiodarone at 200 mg daily. No swallowing or groin issues.  Today, he denies symptoms of palpitations, chest pain, shortness of breath, orthopnea, PND, lower extremity edema, dizziness, presyncope, syncope, or neurologic sequela. The patient is tolerating medications without difficulties and is otherwise without complaint today.   Past Medical History:  Diagnosis Date  . Carpal tunnel syndrome    Bilateral  . Chronic atrial fibrillation 10/01/2017  . Chronic obstructive pulmonary disease ([place]) 01/13/2014   Overview:  Mild.  [facility] is following.  . Chronic systolic heart failure ([place])   . Elevated PSA 09/21/2015  . Essential hypertension 04/25/2014  . Hypertension, essential 10/01/2017  . Non-rheumatic mitral regurgitation 07/23/2017  . Noncompliance 08/01/2017  . Nonischemic cardiomyopathy ([place]) 07/23/2017  . Nonrheumatic mitral valve regurgitation 10/01/2017  . Obesity (BMI 30-39.9) 02/15/2015  . OSA (obstructive sleep apnea) 05/10/2014  . Persistent atrial fibrillation   . Prediabetes 09/27/2015  . S/P laparoscopic appendectomy 02/15/2013  . Sleep apnea                          DECIDED AGAINST CPAP 10/01/2017  . Ulnar neuropathy    Past Surgical History:  Procedure Laterality Date  . APPENDECTOMY  02/15/2013  . ATRIAL FIBRILLATION ABLATION N/A 11/13/2017   Procedure: ATRIAL FIBRILLATION ABLATION;  Surgeon: Constance Haw, MD;  Location: [facility];  Service: Cardiovascular;  Laterality: N/A;  . sessile colonic polyp N/A 02/15/2013    Current Outpatient Medications  Medication Sig Dispense Refill  . amiodarone (PACERONE) 200 MG tablet Take 1 tablet (200 mg total) by mouth daily. 30  tablet 6  . atorvastatin (LIPITOR) 80 MG tablet Take 1 tablet (80 mg total) by mouth daily. 30 tablet 6  . diclofenac sodium (VOLTAREN) 1 % GEL Apply 2 g topically 4 (four) times daily. To affected joint. (Patient taking differently: Apply 2 g topically daily as needed (joint pain). ) 100 g 11  . fluticasone (FLONASE) 50 MCG/ACT nasal spray Place 2 sprays into both nostrils daily. APPOINTMENT NEEDED FOR FURTHER REFILLS (Patient taking differently: Place 2 sprays into both nostrils daily as needed for allergies. APPOINTMENT NEEDED FOR FURTHER REFILLS) 16 g 0  . Fluticasone-Salmeterol (ADVAIR) 250-50 MCG/DOSE AEPB Inhale 1 puff into the lungs 2 (two) times daily.    . furosemide (LASIX) 40 MG tablet Take 1 tablet (40 mg total) by mouth daily. 90 tablet 1  . guaiFENesin (MUCINEX) 600 MG 12 hr tablet Take by mouth 2 (two) times daily.    Marland Kitchen lisinopril (PRINIVIL,ZESTRIL) 5 MG tablet Take 5 mg by mouth daily.    Marland Kitchen loratadine (CLARITIN) 10 MG tablet Take 10 mg by mouth daily.    . metoprolol succinate (TOPROL-XL) 50 MG 24 hr tablet Take 50 mg by mouth daily. Take with or immediately following a meal.    . rivaroxaban (XARELTO) 20 MG TABS tablet Take 20 mg by mouth daily.     No current facility-administered medications for this encounter.     No Known Allergies  Social History   Socioeconomic History  . Marital status: Married    Spouse name: Not on file  .  Number of children: Not on file  . Years of education: Not on file  . Highest education level: Not on file  Occupational History  . Not on file  Social Needs  . Financial resource strain: Not on file  . Food insecurity:    Worry: Not on file    Inability: Not on file  . Transportation needs:    Medical: Not on file    Non-medical: Not on file  Tobacco Use  . Smoking status: Never Smoker  . Smokeless tobacco: Never Used  Substance and Sexual Activity  . Alcohol use: No  . Drug use: No  . Sexual activity: Yes  Lifestyle  .  Physical activity:    Days per week: Not on file    Minutes per session: Not on file  . Stress: Not on file  Relationships  . Social connections:    Talks on phone: Not on file    Gets together: Not on file    Attends religious service: Not on file    Active member of club or organization: Not on file    Attends meetings of clubs or organizations: Not on file    Relationship status: Not on file  . Intimate partner violence:    Fear of current or ex partner: Not on file    Emotionally abused: Not on file    Physically abused: Not on file    Forced sexual activity: Not on file  Other Topics Concern  . Not on file  Social History Narrative  . Not on file    Family History  Problem Relation Age of Onset  . Cancer Mother   . Hypertension Father   . COPD Father   . Diabetes Father     ROS- All systems are reviewed and negative except as per the HPI above  Physical Exam: Vitals:   12/09/17 1051  BP: 126/74  Pulse: 69  Weight: 101.2 kg  Height: 6' (1.829 m)   Wt Readings from Last 3 Encounters:  12/09/17 101.2 kg  11/19/17 101.6 kg  11/14/17 100.2 kg    Labs: Lab Results  Component Value Date   NA 141 11/10/2017   K 4.5 11/10/2017   CL 103 11/10/2017   CO2 23 11/10/2017   GLUCOSE 89 11/10/2017   BUN 21 11/10/2017   CREATININE 1.12 11/10/2017   CALCIUM 9.3 11/10/2017   No results found for: INR Lab Results  Component Value Date   CHOL 194 12/27/2016   HDL 80 12/27/2016   LDLCALC 98 12/27/2016   TRIG 75 12/27/2016     GEN- The patient is well appearing, alert and oriented x 3 today.   Head- normocephalic, atraumatic Eyes-  Sclera clear, conjunctiva pink Ears- hearing intact Oropharynx- clear Neck- supple, no JVP Lymph- no cervical lymphadenopathy Lungs- Clear to ausculation bilaterally, normal work of breathing Heart- Regular rate and rhythm, no murmurs, rubs or gallops, PMI not laterally displaced GI- soft, NT, ND, + BS Extremities- no clubbing,  cyanosis, or edema MS- no significant deformity or atrophy Skin- no rash or lesion Psych- euthymic mood, full affect Neuro- strength and sensation are intact  EKG- SR at 69 bpm, pr int 158 ms, qrs int 88 ms, qtc 467 ms Epic  records reviewed   Assessment and Plan: 1. Persistent afib One month s/p ablation Maintaining SR Feels  Improved Continue amiodarone 200 mg daily, will probably be able to stop this drug with f/u with Dr. Curt Bears 12/17 Has returned to normal activities  without issues  2. Chadsvasc score of at least 3 Continue xarelto 20 mg daily  3. HTN Stable   F/u with Dr. Curt Bears 12/17  Geroge Baseman. Milta Croson, [facility] [address] [place], [place] [address] [phone]

## 2017-12-22 ENCOUNTER — Ambulatory Visit (INDEPENDENT_AMBULATORY_CARE_PROVIDER_SITE_OTHER): Payer: Managed Care, Other (non HMO) | Admitting: Family Medicine

## 2017-12-22 ENCOUNTER — Encounter: Payer: Self-pay | Admitting: Family Medicine

## 2017-12-22 VITALS — BP 141/75 | HR 72 | Ht 73.0 in | Wt 223.0 lb

## 2017-12-22 DIAGNOSIS — R972 Elevated prostate specific antigen [PSA]: Secondary | ICD-10-CM

## 2017-12-22 DIAGNOSIS — I482 Chronic atrial fibrillation, unspecified: Secondary | ICD-10-CM | POA: Diagnosis not present

## 2017-12-22 DIAGNOSIS — R399 Unspecified symptoms and signs involving the genitourinary system: Secondary | ICD-10-CM

## 2017-12-22 DIAGNOSIS — R319 Hematuria, unspecified: Secondary | ICD-10-CM | POA: Diagnosis not present

## 2017-12-22 DIAGNOSIS — I428 Other cardiomyopathies: Secondary | ICD-10-CM | POA: Diagnosis not present

## 2017-12-22 NOTE — Progress Notes (Signed)
Christian Ortiz is a 67 y.o. male who presents to [facility] today for follow-up atrial fibrillation. Christian "Gene" has been seen recently by his cardiologist for follow-up of atrial fibrillation.  He was continued on amiodarone until at least December.  He notes from a heart standpoint he is feeling well with no chest pain palpitations or shortness of breath.  He is back at work doing full duties.  He does however note some hematuria.  He describes painless blood in the urine occurring last week.  He notes that resolves on its own.  He denies any fevers chills nausea vomiting diarrhea.  He continues Xarelto.  He has a history of elevated PSA but it has been a while since he seen his urologist.   ROS as above:  Exam:  BP (!) 141/75   Pulse 72   Ht 6\' 1"  (1.854 m)   Wt 223 lb (101.2 kg)   BMI 29.42 kg/m  Wt Readings from Last 5 Encounters:  12/22/17 223 lb (101.2 kg)  12/09/17 223 lb (101.2 kg)  11/19/17 224 lb (101.6 kg)  11/14/17 220 lb 14.4 oz (100.2 kg)  10/22/17 220 lb (99.8 kg)    Gen: Well NAD HEENT: EOMI,  MMM Lungs: Normal work of breathing. CTABL Heart: RRR no MRG Abd: NABS, Soft. Nondistended, Nontender Exts: Brisk capillary refill, warm and well perfused.      Assessment and Plan: 67 y.o. male with  Atrial fibrillation: Doing quite well.  Per my exam patient seems to be in normal rhythm.  Functionally he is doing quite well and back to work with full duties.  Continue current regimen comanagement with cardiology.  Hematuria: Plan for urinalysis if positive refer back to urology.  He likely should follow-up with urology for PSA as well.  We will check PSA as well as other labs ordered at last visit.  Patient never got this done but will get done here in the near future.    Orders Placed This Encounter  Procedures  . MICROSCOPIC MESSAGE    . Urinalysis, Routine w reflex microscopic   No orders of the defined types were placed in this encounter.    Historical information moved to improve visibility of documentation.  Past Medical History:  Diagnosis Date  . Carpal tunnel syndrome    Bilateral  . Chronic atrial fibrillation 10/01/2017  . Chronic obstructive pulmonary disease ([place]) 01/13/2014   Overview:  Mild.  [facility] is following.  . Chronic systolic heart failure ([place])   . Elevated PSA 09/21/2015  . Essential hypertension 04/25/2014  . Hypertension, essential 10/01/2017  . Non-rheumatic mitral regurgitation 07/23/2017  . Noncompliance 08/01/2017  . Nonischemic cardiomyopathy ([place]) 07/23/2017  . Nonrheumatic mitral valve regurgitation 10/01/2017  . Obesity (BMI 30-39.9) 02/15/2015  . OSA (obstructive sleep apnea) 05/10/2014  . Persistent atrial fibrillation   . Prediabetes 09/27/2015  . S/P laparoscopic appendectomy 02/15/2013  . Sleep apnea                          DECIDED AGAINST CPAP 10/01/2017  . Ulnar neuropathy    Past Surgical History:  Procedure Laterality Date  . APPENDECTOMY  02/15/2013  . ATRIAL FIBRILLATION ABLATION N/A 11/13/2017   Procedure: ATRIAL FIBRILLATION ABLATION;  Surgeon: Constance Haw, MD;  Location: [facility];  Service: Cardiovascular;  Laterality: N/A;  . sessile colonic polyp N/A 02/15/2013  Social History   Tobacco Use  . Smoking status: Never Smoker  . Smokeless tobacco: Never Used  Substance Use Topics  . Alcohol use: No   family history includes COPD in his father; Cancer in his mother; Diabetes in his father; Hypertension in his father.  Medications: Current Outpatient Medications  Medication Sig Dispense Refill  . amiodarone (PACERONE) 200 MG tablet Take 1 tablet (200 mg total) by mouth daily. 30 tablet 6  . atorvastatin (LIPITOR) 80 MG tablet Take 1 tablet (80 mg total) by mouth daily. 30 tablet 6  . diclofenac sodium (VOLTAREN) 1 % GEL Apply 2 g topically  4 (four) times daily. To affected joint. (Patient taking differently: Apply 2 g topically daily as needed (joint pain). ) 100 g 11  . fluticasone (FLONASE) 50 MCG/ACT nasal spray Place 2 sprays into both nostrils daily. APPOINTMENT NEEDED FOR FURTHER REFILLS (Patient taking differently: Place 2 sprays into both nostrils daily as needed for allergies. APPOINTMENT NEEDED FOR FURTHER REFILLS) 16 g 0  . Fluticasone-Salmeterol (ADVAIR) 250-50 MCG/DOSE AEPB Inhale 1 puff into the lungs 2 (two) times daily.    . furosemide (LASIX) 40 MG tablet Take 1 tablet (40 mg total) by mouth daily. 90 tablet 1  . guaiFENesin (MUCINEX) 600 MG 12 hr tablet Take by mouth 2 (two) times daily.    Marland Kitchen lisinopril (PRINIVIL,ZESTRIL) 5 MG tablet Take 5 mg by mouth daily.    Marland Kitchen loratadine (CLARITIN) 10 MG tablet Take 10 mg by mouth daily.    . metoprolol succinate (TOPROL-XL) 50 MG 24 hr tablet Take 50 mg by mouth daily. Take with or immediately following a meal.    . rivaroxaban (XARELTO) 20 MG TABS tablet Take 20 mg by mouth daily.     No current facility-administered medications for this visit.    No Known Allergies   Discussed warning signs or symptoms. Please see discharge instructions. Patient expresses understanding.

## 2017-12-22 NOTE — Patient Instructions (Addendum)
Thank you for coming in today. Get labs now and urine lab.  I will contact you with results and get urine blood test back  Recheck with me in 3 months or sooner if needed.    Hematuria, Adult Hematuria is blood in your urine. It can be caused by a bladder infection, kidney infection, prostate infection, kidney stone, or cancer of your urinary tract. Infections can usually be treated with medicine, and a kidney stone usually will pass through your urine. If neither of these is the cause of your hematuria, further workup to find out the reason may be needed. It is very important that you tell your health care provider about any blood you see in your urine, even if the blood stops without treatment or happens without causing pain. Blood in your urine that happens and then stops and then happens again can be a symptom of a very serious condition. Also, pain is not a symptom in the initial stages of many urinary cancers. Follow these instructions at home:  Drink lots of fluid, 3-4 quarts a day. If you have been diagnosed with an infection, cranberry juice is especially recommended, in addition to large amounts of water.  Avoid caffeine, tea, and carbonated beverages because they tend to irritate the bladder.  Avoid alcohol because it may irritate the prostate.  Take all medicines as directed by your health care provider.  If you were prescribed an antibiotic medicine, finish it all even if you start to feel better.  If you have been diagnosed with a kidney stone, follow your health care provider's instructions regarding straining your urine to catch the stone.  Empty your bladder often. Avoid holding urine for long periods of time.  After a bowel movement, women should cleanse front to back. Use each tissue only once.  Empty your bladder before and after sexual intercourse if you are a male. Contact a health care provider if:  You develop back pain.  You have a fever.  You have a  feeling of sickness in your stomach (nausea) or vomiting.  Your symptoms are not better in 3 days. Return sooner if you are getting worse. Get help right away if:  You develop severe vomiting and are unable to keep the medicine down.  You develop severe back or abdominal pain despite taking your medicines.  You begin passing a large amount of blood or clots in your urine.  You feel extremely weak or faint, or you pass out. This information is not intended to replace advice given to you by your health care provider. Make sure you discuss any questions you have with your health care provider. Document Released: 02/11/2005 Document Revised: 07/20/2015 Document Reviewed: 10/12/2012 Elsevier Interactive Patient Education  2017 [employer].

## 2017-12-23 LAB — LIPID PANEL W/REFLEX DIRECT LDL
Cholesterol: 152 mg/dL (ref <200)
HDL: 66 mg/dL (ref >40)
LDL Cholesterol (Calc): 69 mg/dL (calc)
NON-HDL CHOLESTEROL (CALC): 86 mg/dL (ref <130)
Total CHOL/HDL Ratio: 2.3 (calc) (ref <5.0)
Triglycerides: 89 mg/dL (ref <150)

## 2017-12-23 LAB — URINALYSIS, ROUTINE W REFLEX MICROSCOPIC
BILIRUBIN URINE: NEGATIVE
Bacteria, UA: NONE SEEN /HPF
GLUCOSE, UA: NEGATIVE
Hyaline Cast: NONE SEEN /LPF
Ketones, ur: NEGATIVE
LEUKOCYTES UA: NEGATIVE
NITRITE: NEGATIVE
PH: 5.5 (ref 5.0–8.0)
Specific Gravity, Urine: 1.029 (ref 1.001–1.03)
Squamous Epithelial / LPF: NONE SEEN /HPF (ref <=5)
WBC, UA: NONE SEEN /HPF (ref 0–5)

## 2017-12-23 LAB — CBC
HCT: 40.1 % (ref 38.5–50.0)
Hemoglobin: 13.6 g/dL (ref 13.2–17.1)
MCH: 31.7 pg (ref 27.0–33.0)
MCHC: 33.9 g/dL (ref 32.0–36.0)
MCV: 93.5 fL (ref 80.0–100.0)
MPV: 10.5 fL (ref 7.5–12.5)
PLATELETS: 250 10*3/uL (ref 140–400)
RBC: 4.29 10*6/uL (ref 4.20–5.80)
RDW: 12.5 % (ref 11.0–15.0)
WBC: 6.2 10*3/uL (ref 3.8–10.8)

## 2017-12-23 LAB — PSA: PSA: 4.5 ng/mL — AB (ref <=4.0)

## 2017-12-23 LAB — HEMOGLOBIN A1C
EAG (MMOL/L): 6.5 (calc)
Hgb A1c MFr Bld: 5.7 % of total Hgb — ABNORMAL HIGH (ref <5.7)
MEAN PLASMA GLUCOSE: 117 (calc)

## 2018-02-10 ENCOUNTER — Encounter: Payer: Self-pay | Admitting: Cardiology

## 2018-02-10 ENCOUNTER — Ambulatory Visit (INDEPENDENT_AMBULATORY_CARE_PROVIDER_SITE_OTHER): Payer: Managed Care, Other (non HMO) | Admitting: Cardiology

## 2018-02-10 VITALS — BP 116/64 | HR 61 | Ht 73.0 in | Wt 222.0 lb

## 2018-02-10 DIAGNOSIS — I428 Other cardiomyopathies: Secondary | ICD-10-CM

## 2018-02-10 DIAGNOSIS — I4819 Other persistent atrial fibrillation: Secondary | ICD-10-CM | POA: Diagnosis not present

## 2018-02-10 DIAGNOSIS — I10 Essential (primary) hypertension: Secondary | ICD-10-CM | POA: Diagnosis not present

## 2018-02-10 NOTE — Progress Notes (Signed)
Electrophysiology Office Note   Date:  02/10/2018   ID:  Christian Ortiz, Christian Ortiz 1950-06-04, MRN [id number]  PCP:  Gregor Hams, MD  Cardiologist:  Cathlean Sauer MD [facility]) Primary Electrophysiologist:  Dr Curt Bears    CC: Follow up for atrial fibrillation s/p afib ablation.   History of Present Illness: Christian Ortiz is a 67 y.o. male who is being seen today for the evaluation of atrial fibrillation at the request of [facility]. Presenting today for electrophysiology evaluation.  He has a history of atrial fibrillation, COPD, hypertension, nonischemic cardiomyopathy, obesity, obstructive sleep apnea.  He presented to the emergency room 07/22/2017 with atrial fibrillation and rapid rates.  He was admitted to the hospital with an echo showing a mild cardiomyopathy with an EF of 40 to 45%.  He is found to have 3+ mitral insufficiency though his mitral valve looks normal on echo.  He was started on amiodarone and underwent a TEE guided cardioversion which was unsuccessful after 3 shocks.  On return to clinic, he was in sinus rhythm.    Today, denies symptoms of palpitations, chest pain, shortness of breath, orthopnea, PND, lower extremity edema, claudication, dizziness, presyncope, syncope, bleeding, or neurologic sequela. The patient is tolerating medications without difficulties. He is now s/p afib ablation on 11/13/17. He reports that he feels much better in sinus rhythm with improvement in his energy level. There have been no noted episodes of atrial fibrillation.   Past Medical History:  Diagnosis Date  . Carpal tunnel syndrome    Bilateral  . Chronic atrial fibrillation 10/01/2017  . Chronic obstructive pulmonary disease ([place]) 01/13/2014   Overview:  Mild.  [facility] is following.  . Chronic systolic heart failure ([place])   . Elevated PSA 09/21/2015  . Essential hypertension 04/25/2014  . Hypertension, essential 10/01/2017  . Non-rheumatic mitral regurgitation 07/23/2017  .  Noncompliance 08/01/2017  . Nonischemic cardiomyopathy ([place]) 07/23/2017  . Nonrheumatic mitral valve regurgitation 10/01/2017  . Obesity (BMI 30-39.9) 02/15/2015  . OSA (obstructive sleep apnea) 05/10/2014  . Persistent atrial fibrillation   . Prediabetes 09/27/2015  . S/P laparoscopic appendectomy 02/15/2013  . Sleep apnea                          DECIDED AGAINST CPAP 10/01/2017  . Ulnar neuropathy    Past Surgical History:  Procedure Laterality Date  . APPENDECTOMY  02/15/2013  . ATRIAL FIBRILLATION ABLATION N/A 11/13/2017   Procedure: ATRIAL FIBRILLATION ABLATION;  Surgeon: Constance Haw, MD;  Location: [facility];  Service: Cardiovascular;  Laterality: N/A;  . sessile colonic polyp N/A 02/15/2013     Current Outpatient Medications  Medication Sig Dispense Refill  . amiodarone (PACERONE) 200 MG tablet Take 1 tablet (200 mg total) by mouth daily. 30 tablet 6  . atorvastatin (LIPITOR) 80 MG tablet Take 1 tablet (80 mg total) by mouth daily. 30 tablet 6  . diclofenac sodium (VOLTAREN) 1 % GEL Apply 2 g topically 4 (four) times daily. To affected joint. 100 g 11  . fluticasone (FLONASE) 50 MCG/ACT nasal spray Place 2 sprays into both nostrils daily. APPOINTMENT NEEDED FOR FURTHER REFILLS 16 g 0  . Fluticasone-Salmeterol (ADVAIR) 250-50 MCG/DOSE AEPB Inhale 1 puff into the lungs 2 (two) times daily.    . furosemide (LASIX) 40 MG tablet Take 1 tablet (40 mg total) by mouth daily. 90 tablet 1  . guaiFENesin (MUCINEX) 600 MG 12 hr tablet Take by  mouth 2 (two) times daily.    Marland Kitchen lisinopril (PRINIVIL,ZESTRIL) 5 MG tablet Take 5 mg by mouth daily.    Marland Kitchen loratadine (CLARITIN) 10 MG tablet Take 10 mg by mouth daily.    . metoprolol succinate (TOPROL-XL) 50 MG 24 hr tablet Take 50 mg by mouth daily. Take with or immediately following a meal.    . rivaroxaban (XARELTO) 20 MG TABS tablet Take 20 mg by mouth daily.     No current facility-administered medications for this visit.      Allergies:   Patient has no known allergies.   Social History:  The patient  reports that he has never smoked. He has never used smokeless tobacco. He reports that he does not drink alcohol or use drugs.   Family History:  The patient's family history includes COPD in his father; Cancer in his mother; Diabetes in his father; Hypertension in his father.    ROS:  Please see the history of present illness.   Otherwise, review of systems is positive for easy bruising, back pain, and cough.   All other systems are reviewed and negative.   PHYSICAL EXAM: VS:  BP 116/64   Pulse 61   Ht 6\' 1"  (1.854 m)   Wt 222 lb (100.7 kg)   BMI 29.29 kg/m  , BMI Body mass index is 29.29 kg/m. GEN: Well nourished, well developed, in no acute distress  HEENT: normal  Neck: no JVD, carotid bruits, or masses Cardiac: RRR; no murmurs, rubs, or gallops,no edema  Respiratory:  clear to auscultation bilaterally, normal work of breathing GI: soft, nontender, nondistended, + BS MS: no deformity or atrophy  Skin: warm and dry Neuro:  Strength and sensation are intact Psych: euthymic mood, full affect  EKG:  EKG is ordered today. Personal review of the ekg ordered shows sinus rhythm HR 61, LVH, PR 162, QRS 90, QTc 481   Recent Labs: 11/10/2017: BUN 21; Creatinine, Ser 1.12; Potassium 4.5; Sodium 141 12/22/2017: Hemoglobin 13.6; Platelets 250    Lipid Panel     Component Value Date/Time   CHOL 152 12/22/2017 1554   TRIG 89 12/22/2017 1554   HDL 66 12/22/2017 1554   CHOLHDL 2.3 12/22/2017 1554   VLDL 10 09/26/2015 1114   LDLCALC 69 12/22/2017 1554     Wt Readings from Last 3 Encounters:  02/10/18 222 lb (100.7 kg)  12/22/17 223 lb (101.2 kg)  12/09/17 223 lb (101.2 kg)      Other studies Reviewed: Additional studies/ records that were reviewed today include: TTE 07/23/17  Review of the above records today demonstrates:  The left ventricle is mildly dilated. There is mild concentric left  ventricular hypertrophy. There is mild diffuse hypokinesis of the left ventricle. The left ventricular ejection fraction is moderately reduced (40-45%). Grade I mild diastolic dysfunction; abnormal relaxation pattern. The left atrium is moderately dilated. There is mild (1+) tricuspid regurgitation. There is mild aortic valve thickening with trace regurgitation.   ASSESSMENT AND PLAN:  1.  Persistent atrial fibrillation: S/p afib ablation on 11/13/17.  Currently in sinus rhythm on amiodarone. We Deshundra Waller continue this to the next appointment. Tyriq Moragne consider stopping at that point.  Continue Xarelto 20 mg daily.  This patients CHA2DS2-VASc Score and unadjusted Ischemic Stroke Rate (% per year) is equal to 3.2 % stroke rate/year from a score of 3  Above score calculated as 1 point each if present [CHF, HTN, DM, Vascular=MI/PAD/Aortic Plaque, Age if 65-74, or Male] Above score calculated as  2 points each if present [Age > 75, or Stroke/TIA/TE]  2.  Hypertension: Stable, no changes today  3.  Nonischemic cardiomyopathy: Likely due to rapid atrial fibrillation. Currently on optimal medical therapy.  4. Elevated calcium score: On CT prior to ablation his calcium score was 1003 which represents 93 percentile for age/sex. No anginal symptoms. He Chan Sheahan follow up with his primary cardiologist.   Current medicines are reviewed at length with the patient today.   The patient does not have concerns regarding his medicines.  The following changes were made today:  none  Labs/ tests ordered today include:  Orders Placed This Encounter  Procedures  . EKG 12-Lead    Disposition:   FU with Kaysen Sefcik 3 months  Signed, Travus Oren Meredith Leeds, MD  02/10/2018 1:52 PM     [address] [place] [address] [phone] (office) [phone] (fax)  I have seen and examined this patient with Adline Peals.  Agree with above, note added to reflect my findings.   On exam, RRR, no murmurs, lungs clear.  Status post atrial fibrillation ablation.  Remains in sinus rhythm.  Currently on amiodarone and Xarelto.  Keep him on amiodarone until his next visit in 3 months.  Portia Wisdom likely stop amiodarone at that time.  Preslie Depasquale M. Shadasia Oldfield MD 02/10/2018 1:52 PM

## 2018-02-10 NOTE — Patient Instructions (Signed)
Medication Instructions:  Your physician recommends that you continue on your current medications as directed. Please refer to the Current Medication list given to you today.  Labwork: NONE  Testing/Procedures: NONE  Follow-Up:  Your physician recommends that you schedule a follow-up appointment in: 3 months with Dr. Curt Bears

## 2018-03-24 ENCOUNTER — Ambulatory Visit (INDEPENDENT_AMBULATORY_CARE_PROVIDER_SITE_OTHER): Payer: Managed Care, Other (non HMO) | Admitting: Family Medicine

## 2018-03-24 ENCOUNTER — Encounter: Payer: Self-pay | Admitting: Family Medicine

## 2018-03-24 VITALS — BP 157/86 | HR 57 | Wt 229.0 lb

## 2018-03-24 DIAGNOSIS — K635 Polyp of colon: Secondary | ICD-10-CM | POA: Diagnosis not present

## 2018-03-24 DIAGNOSIS — I428 Other cardiomyopathies: Secondary | ICD-10-CM | POA: Diagnosis not present

## 2018-03-24 DIAGNOSIS — Z23 Encounter for immunization: Secondary | ICD-10-CM

## 2018-03-24 DIAGNOSIS — I10 Essential (primary) hypertension: Secondary | ICD-10-CM | POA: Diagnosis not present

## 2018-03-24 DIAGNOSIS — I480 Paroxysmal atrial fibrillation: Secondary | ICD-10-CM | POA: Insufficient documentation

## 2018-03-24 DIAGNOSIS — I482 Chronic atrial fibrillation, unspecified: Secondary | ICD-10-CM

## 2018-03-24 DIAGNOSIS — E669 Obesity, unspecified: Secondary | ICD-10-CM

## 2018-03-24 MED ORDER — FLUTICASONE PROPIONATE 50 MCG/ACT NA SUSP: 2.0000 | Freq: Every day | NASAL | 12 refills | Status: AC

## 2018-03-24 MED ORDER — DICLOFENAC SODIUM 1 % TD GEL: 2.0000 g | Freq: Four times a day (QID) | TRANSDERMAL | 11 refills | Status: DC

## 2018-03-24 NOTE — Progress Notes (Signed)
Christian Ortiz is a 68 y.o. male who presents to [facility] today for follow-up hypertension.  Christian Ortiz has a history of atrial fibrillation and hypertension.  He takes medications listed below.  He feels well with no chest pain palpitations shortness of breath lightheadedness or dizziness.  He has a follow-up appointment scheduled with his electrophysiology cardiologist in March.  He does have the ability to check his blood pressures at home but has not been checking them.   Additionally he takes medications listed below for hyperlipidemia as well.  No muscle aches or pains.  He additionally notes that he is probably overdue for colonoscopy last colonoscopy was in 2014 and due to polyp was due for 3-year follow-up.  He notes that his heart issue last year superseded any repeat colonoscopies.  ROS as above:  Exam:  BP (!) 157/86   Pulse (!) 57   Wt 229 lb (103.9 kg)   BMI 30.21 kg/m  Wt Readings from Last 5 Encounters:  03/24/18 229 lb (103.9 kg)  02/10/18 222 lb (100.7 kg)  12/22/17 223 lb (101.2 kg)  12/09/17 223 lb (101.2 kg)  11/19/17 224 lb (101.6 kg)    Gen: Well NAD HEENT: EOMI,  MMM Lungs: Normal work of breathing. CTABL Heart: RRR no MRG Abd: NABS, Soft. Nondistended, Nontender Exts: Brisk capillary refill, warm and well perfused.   Lab and Radiology Results   Chemistry      Component Value Date/Time   NA 141 11/10/2017 1432   K 4.5 11/10/2017 1432   CL 103 11/10/2017 1432   CO2 23 11/10/2017 1432   BUN 21 11/10/2017 1432   CREATININE 1.12 11/10/2017 1432   CREATININE 0.94 12/27/2016 1114      Component Value Date/Time   CALCIUM 9.3 11/10/2017 1432   ALKPHOS 66 09/26/2015 1114   AST 18 12/27/2016 1114   ALT 12 12/27/2016 1114   BILITOT 0.7 12/27/2016 1114     Lab Results  Component Value Date   CHOL 152 12/22/2017   HDL 66  12/22/2017   LDLCALC 69 12/22/2017   TRIG 89 12/22/2017   CHOLHDL 2.3 12/22/2017      Assessment and Plan: 68 y.o. male with  Hypertension: Recently well controlled.  Blood pressure is more elevated today.  I am not sure if today is an outlier or represents his typical blood pressure.  Plan for home blood pressure log for 1 week.  If still elevated will increase lisinopril.  Follow as well recheck with me in 5 months and with cardiology in 2 months.  Atrial fibrillation: Rate controlled today.  Heart exam appears to be in rhythm.  Continue Xarelto.  Hyperlipidemia: Lipids well managed with last check.  Continue current regimen and recheck lipids in about 5 months.  Sessile polyp seen on colonoscopy 2014.  Patient was due for recheck in 2017.  We will again place referral back to gastroenterology for repeat colonoscopy.  Prevnar 13 pneumococcal vaccine given today.  Medications refilled.  Obesity: Work on improved diet.  PDMP reviewed during this encounter. Orders Placed This Encounter  Procedures  . Pneumococcal conjugate vaccine 13-valent  . Ambulatory referral to Gastroenterology    Referral Priority:   Routine    Referral Type:   Consultation    Referral Reason:   Specialty Services Required    Number of Visits Requested:   1   Meds ordered this encounter  Medications  . diclofenac  sodium (VOLTAREN) 1 % GEL    Sig: Apply 2 g topically 4 (four) times daily. To affected joint.    Dispense:  100 g    Refill:  11  . fluticasone (FLONASE) 50 MCG/ACT nasal spray    Sig: Place 2 sprays into both nostrils daily.    Dispense:  16 g    Refill:  12     Historical information moved to improve visibility of documentation.  Past Medical History:  Diagnosis Date  . Carpal tunnel syndrome    Bilateral  . Chronic atrial fibrillation 10/01/2017  . Chronic obstructive pulmonary disease ([place]) 01/13/2014   Overview:  Mild.  [facility] is following.  . Chronic systolic heart  failure ([place])   . Elevated PSA 09/21/2015  . Essential hypertension 04/25/2014  . Hypertension, essential 10/01/2017  . Non-rheumatic mitral regurgitation 07/23/2017  . Noncompliance 08/01/2017  . Nonischemic cardiomyopathy ([place]) 07/23/2017  . Nonrheumatic mitral valve regurgitation 10/01/2017  . Obesity (BMI 30-39.9) 02/15/2015  . OSA (obstructive sleep apnea) 05/10/2014  . Persistent atrial fibrillation   . Prediabetes 09/27/2015  . S/P laparoscopic appendectomy 02/15/2013  . Sleep apnea                          DECIDED AGAINST CPAP 10/01/2017  . Ulnar neuropathy    Past Surgical History:  Procedure Laterality Date  . APPENDECTOMY  02/15/2013  . ATRIAL FIBRILLATION ABLATION N/A 11/13/2017   Procedure: ATRIAL FIBRILLATION ABLATION;  Surgeon: Constance Haw, MD;  Location: [facility];  Service: Cardiovascular;  Laterality: N/A;  . sessile colonic polyp N/A 02/15/2013   Social History   Tobacco Use  . Smoking status: Never Smoker  . Smokeless tobacco: Never Used  Substance Use Topics  . Alcohol use: No   family history includes COPD in his father; Cancer in his mother; Diabetes in his father; Hypertension in his father.  Medications: Current Outpatient Medications  Medication Sig Dispense Refill  . amiodarone (PACERONE) 200 MG tablet Take 1 tablet (200 mg total) by mouth daily. 30 tablet 6  . atorvastatin (LIPITOR) 80 MG tablet Take 1 tablet (80 mg total) by mouth daily. 30 tablet 6  . diclofenac sodium (VOLTAREN) 1 % GEL Apply 2 g topically 4 (four) times daily. To affected joint. 100 g 11  . fluticasone (FLONASE) 50 MCG/ACT nasal spray Place 2 sprays into both nostrils daily. 16 g 12  . Fluticasone-Salmeterol (ADVAIR) 250-50 MCG/DOSE AEPB Inhale 1 puff into the lungs 2 (two) times daily.    . furosemide (LASIX) 40 MG tablet Take 1 tablet (40 mg total) by mouth daily. 90 tablet 1  . lisinopril (PRINIVIL,ZESTRIL) 5 MG tablet Take 5 mg by mouth daily.    Marland Kitchen loratadine  (CLARITIN) 10 MG tablet Take 10 mg by mouth daily.    . metoprolol succinate (TOPROL-XL) 50 MG 24 hr tablet Take 50 mg by mouth daily. Take with or immediately following a meal.    . rivaroxaban (XARELTO) 20 MG TABS tablet Take 20 mg by mouth daily.     No current facility-administered medications for this visit.    No Known Allergies   Discussed warning signs or symptoms. Please see discharge instructions. Patient expresses understanding.

## 2018-03-24 NOTE — Patient Instructions (Addendum)
Thank you for coming in today. Keep track of blood pressure. If still high over 120/70.  We will increase the lisinopril from 5 to 10.   Send my blood pressures about about 1 week.   Follow up with me 5 months.   You should hear about colon cancer screening.

## 2018-04-24 ENCOUNTER — Encounter: Payer: Self-pay | Admitting: Family Medicine

## 2018-05-04 ENCOUNTER — Encounter: Payer: Self-pay | Admitting: *Deleted

## 2018-05-04 ENCOUNTER — Other Ambulatory Visit: Payer: Self-pay | Admitting: Family Medicine

## 2018-05-15 ENCOUNTER — Telehealth: Payer: Self-pay | Admitting: Cardiology

## 2018-05-15 ENCOUNTER — Ambulatory Visit: Payer: Managed Care, Other (non HMO) | Admitting: Cardiology

## 2018-05-15 NOTE — Addendum Note (Signed)
Addended by: Stanton Kidney on: 05/15/2018 12:32 PM   Modules accepted: Orders

## 2018-05-15 NOTE — Telephone Encounter (Signed)
Home reported VS:  BP 133/76, HR 64, weight 220 lb  Stopping Amiodarone 59mo recall placed in EPIC

## 2018-05-15 NOTE — Telephone Encounter (Signed)
I spoke with Mr. Christian Ortiz today over the phone as a virtual visit.  He is feeling well.  He has noted no further episodes of atrial fibrillation.  He is able to do all his daily activities without restraint.  He is currently on amiodarone.  We had previously talked about stopping his amiodarone if he remains free of arrhythmia.  We [name] stop that today.  As he is feeling well, I [name] see him back in 6 months.  Allegra Lai, MD

## 2018-05-22 ENCOUNTER — Encounter: Payer: Self-pay | Admitting: Family Medicine

## 2018-06-27 ENCOUNTER — Telehealth: Payer: Managed Care, Other (non HMO) | Admitting: Physician Assistant

## 2018-06-27 DIAGNOSIS — J208 Acute bronchitis due to other specified organisms: Secondary | ICD-10-CM | POA: Diagnosis not present

## 2018-06-27 DIAGNOSIS — B9689 Other specified bacterial agents as the cause of diseases classified elsewhere: Secondary | ICD-10-CM | POA: Diagnosis not present

## 2018-06-27 MED ORDER — DOXYCYCLINE HYCLATE 100 MG PO TABS: 100.0000 mg | ORAL_TABLET | Freq: Two times a day (BID) | ORAL | 0 refills | Status: DC

## 2018-06-27 MED ORDER — BENZONATATE 100 MG PO CAPS: 100.0000 mg | ORAL_CAPSULE | Freq: Three times a day (TID) | ORAL | 0 refills | Status: DC | PRN

## 2018-06-27 NOTE — Progress Notes (Signed)

## 2018-06-27 NOTE — Progress Notes (Signed)
I have spent 5 minutes in review of e-visit questionnaire, review and updating patient chart, medical decision making and response to patient.   Kambre Messner Cody Mckyle Solanki, PA-C    

## 2018-06-29 ENCOUNTER — Encounter: Payer: Self-pay | Admitting: Family Medicine

## 2018-06-29 ENCOUNTER — Ambulatory Visit (INDEPENDENT_AMBULATORY_CARE_PROVIDER_SITE_OTHER): Payer: Managed Care, Other (non HMO) | Admitting: Family Medicine

## 2018-06-29 VITALS — BP 137/84 | HR 74 | Temp 99.0°F | Ht 72.0 in | Wt 224.0 lb

## 2018-06-29 DIAGNOSIS — R05 Cough: Secondary | ICD-10-CM | POA: Diagnosis not present

## 2018-06-29 DIAGNOSIS — R6889 Other general symptoms and signs: Secondary | ICD-10-CM

## 2018-06-29 DIAGNOSIS — Z20822 Contact with and (suspected) exposure to covid-19: Secondary | ICD-10-CM

## 2018-06-29 DIAGNOSIS — R059 Cough, unspecified: Secondary | ICD-10-CM

## 2018-06-29 MED ORDER — GUAIFENESIN-CODEINE 100-10 MG/5ML PO SOLN: 5.0000 mL | Freq: Four times a day (QID) | ORAL | 0 refills | Status: DC | PRN

## 2018-06-29 NOTE — Progress Notes (Signed)
Virtual Visit  via Video Note  I connected with      Christian Ortiz by a video enabled telemedicine application and verified that I am speaking with the correct person using two identifiers.   I discussed the limitations of evaluation and management by telemedicine and the availability of in person appointments. The patient expressed understanding and agreed to proceed.  History of Present Illness: Christian Ortiz is a 68 y.o. male who would like to discuss cough.  Patient became ill on May 2.  He was seen for that same day with an ED visit.  He was thought to have bronchitis and prescribed doxycycline and Tessalon Perles.  He notes the cough is still persistent.  He notes a little bit of nasal discharge as well.  He denies shortness of breath fevers chills or body aches.  He denies any loss of sense of taste or smell.  He denies any known exposures to COVID-19.  He notes Tessalon Perles are not sufficient to control cough which is interfering with sleep.      Observations/Objective: BP 137/84   Pulse 74   Temp 99 F (37.2 C) (Oral)   Ht 6' (1.829 m)   Wt 224 lb (101.6 kg)   BMI 30.38 kg/m  Wt Readings from Last 5 Encounters:  06/29/18 224 lb (101.6 kg)  03/24/18 229 lb (103.9 kg)  02/10/18 222 lb (100.7 kg)  12/22/17 223 lb (101.2 kg)  12/09/17 223 lb (101.2 kg)   Exam: Appearance nontoxic no acute distress Normal Speech.  No hoarseness or shortness of breath.  No tachypnea or wheeze.    Assessment and Plan: 68 y.o. male with cough.  Unclear etiology likely viral URI or bronchitis.  Reasonable to complete doxycycline course.  [id number] is certainly a possibility and will write a work note for those precautions.  Recommend self-isolation per CDC guidelines.  We will also do home monitoring.  We will send in codeine-containing cough syrup for cough suppression.  Recommend also over-the-counter medications.  Recheck as needed.  Precautions reviewed.  PDMP reviewed during  this encounter. No orders of the defined types were placed in this encounter.  Meds ordered this encounter  Medications  . guaiFENesin-codeine 100-10 MG/5ML syrup    Sig: Take 5 mLs by mouth every 6 (six) hours as needed.    Dispense:  120 mL    Refill:  0    Follow Up Instructions:    I discussed the assessment and treatment plan with the patient. The patient was provided an opportunity to ask questions and all were answered. The patient agreed with the plan and demonstrated an understanding of the instructions.   The patient was advised to call back or seek an in-person evaluation if the symptoms worsen or if the condition fails to improve as anticipated.  Time: 15 minutes of intraservice time, with >22 minutes of total time during today's visit.      Historical information moved to improve visibility of documentation.  Past Medical History:  Diagnosis Date  . Carpal tunnel syndrome    Bilateral  . Chronic atrial fibrillation 10/01/2017  . Chronic obstructive pulmonary disease ([place]) 01/13/2014   Overview:  Mild.  [facility] is following.  . Chronic systolic heart failure ([place])   . Elevated PSA 09/21/2015  . Essential hypertension 04/25/2014  . Hypertension, essential 10/01/2017  . Non-rheumatic mitral regurgitation 07/23/2017  . Noncompliance 08/01/2017  . Nonischemic cardiomyopathy ([place]) 07/23/2017  . Nonrheumatic mitral valve regurgitation  10/01/2017  . Obesity (BMI 30-39.9) 02/15/2015  . OSA (obstructive sleep apnea) 05/10/2014  . Persistent atrial fibrillation   . Prediabetes 09/27/2015  . S/P laparoscopic appendectomy 02/15/2013  . Sleep apnea                          DECIDED AGAINST CPAP 10/01/2017  . Ulnar neuropathy    Past Surgical History:  Procedure Laterality Date  . APPENDECTOMY  02/15/2013  . ATRIAL FIBRILLATION ABLATION N/A 11/13/2017   Procedure: ATRIAL FIBRILLATION ABLATION;  Surgeon: Constance Haw, MD;  Location: [facility];  Service:  Cardiovascular;  Laterality: N/A;  . sessile colonic polyp N/A 02/15/2013   Social History   Tobacco Use  . Smoking status: Never Smoker  . Smokeless tobacco: Never Used  Substance Use Topics  . Alcohol use: No   family history includes COPD in his father; Cancer in his mother; Diabetes in his father; Hypertension in his father.  Medications: Current Outpatient Medications  Medication Sig Dispense Refill  . atorvastatin (LIPITOR) 80 MG tablet Take 1 tablet (80 mg total) by mouth daily. 30 tablet 6  . benzonatate (TESSALON) 100 MG capsule Take 1 capsule (100 mg total) by mouth 3 (three) times daily as needed for cough. 30 capsule 0  . diclofenac sodium (VOLTAREN) 1 % GEL Apply 2 g topically 4 (four) times daily. To affected joint. 100 g 11  . doxycycline (VIBRA-TABS) 100 MG tablet Take 1 tablet (100 mg total) by mouth 2 (two) times daily. 14 tablet 0  . fluticasone (FLONASE) 50 MCG/ACT nasal spray Place 2 sprays into both nostrils daily. 16 g 12  . Fluticasone-Salmeterol (ADVAIR) 250-50 MCG/DOSE AEPB Inhale 1 puff into the lungs 2 (two) times daily.    . furosemide (LASIX) 40 MG tablet TAKE 1 TABLET BY MOUTH EVERY DAY 30 tablet 1  . lisinopril (PRINIVIL,ZESTRIL) 5 MG tablet Take 5 mg by mouth daily.    Marland Kitchen loratadine (CLARITIN) 10 MG tablet Take 10 mg by mouth daily.    . metoprolol succinate (TOPROL-XL) 50 MG 24 hr tablet Take 50 mg by mouth daily. Take with or immediately following a meal.    . rivaroxaban (XARELTO) 20 MG TABS tablet Take 20 mg by mouth daily.    Marland Kitchen guaiFENesin-codeine 100-10 MG/5ML syrup Take 5 mLs by mouth every 6 (six) hours as needed. 120 mL 0   No current facility-administered medications for this visit.    No Known Allergies

## 2018-06-29 NOTE — Patient Instructions (Addendum)
Thank you for coming in today.  For cough I recommend using over the counter menthol-containing cough drops.  Make sure to use the sugar-free kind.  Also use over-the-counter dextromethorphan this is also known as Robitussin.  I have prescribed codeine-containing cough syrup that can help as well.  You can use this every 6 hours but mostly use it around bedtime as it may make you sleepy.  Keep me updated especially if your symptoms worsen.       Person Under Monitoring Name: Christian Ortiz  Location: [place] [address]   Infection Prevention Recommendations for Individuals Confirmed to have, or Being Evaluated for, 2019 Novel Coronavirus (COVID-19) Infection Who Receive Care at Home  Individuals who are confirmed to have, or are being evaluated for, COVID-19 should follow the prevention steps below until a healthcare provider or local or state health department says they can return to normal activities.  Stay home except to get medical care You should restrict activities outside your home, except for getting medical care. Do not go to work, school, or public areas, and do not use public transportation or taxis.  Call ahead before visiting your doctor Before your medical appointment, call the healthcare provider and tell them that you have, or are being evaluated for, COVID-19 infection. This will help the healthcare provider's office take steps to keep other people from getting infected. Ask your healthcare provider to call the local or state health department.  Monitor your symptoms Seek prompt medical attention if your illness is worsening (e.g., difficulty breathing). Before going to your medical appointment, call the healthcare provider and tell them that you have, or are being evaluated for, COVID-19 infection. Ask your healthcare provider to call the local or state health department.  Wear a facemask You should wear a facemask that covers your nose  and mouth when you are in the same room with other people and when you visit a healthcare provider. People who live with or visit you should also wear a facemask while they are in the same room with you.  Separate yourself from other people in your home As much as possible, you should stay in a different room from other people in your home. Also, you should use a separate bathroom, if available.  Avoid sharing household items You should not share dishes, drinking glasses, cups, eating utensils, towels, bedding, or other items with other people in your home. After using these items, you should wash them thoroughly with soap and water.  Cover your coughs and sneezes Cover your mouth and nose with a tissue when you cough or sneeze, or you can cough or sneeze into your sleeve. Throw used tissues in a lined trash can, and immediately wash your hands with soap and water for at least 20 seconds or use an alcohol-based hand rub.  Wash your [facility] your hands often and thoroughly with soap and water for at least 20 seconds. You can use an alcohol-based hand sanitizer if soap and water are not available and if your hands are not visibly dirty. Avoid touching your eyes, nose, and mouth with unwashed hands.   Prevention Steps for Caregivers and Household Members of Individuals Confirmed to have, or Being Evaluated for, COVID-19 Infection Being Cared for in the Home  If you live with, or provide care at home for, a person confirmed to have, or being evaluated for, COVID-19 infection please follow these guidelines to prevent infection:  Follow healthcare provider's instructions Make  sure that you understand and can help the patient follow any healthcare provider instructions for all care.  Provide for the patient's basic needs You should help the patient with basic needs in the home and provide support for getting groceries, prescriptions, and other personal needs.  Monitor the patient's  symptoms If they are getting sicker, call his or her medical provider and tell them that the patient has, or is being evaluated for, COVID-19 infection. This will help the healthcare provider's office take steps to keep other people from getting infected. Ask the healthcare provider to call the local or state health department.  Limit the number of people who have contact with the patient  If possible, have only one caregiver for the patient.  Other household members should stay in another home or place of residence. If this is not possible, they should stay  in another room, or be separated from the patient as much as possible. Use a separate bathroom, if available.  Restrict visitors who do not have an essential need to be in the home.  Keep older adults, very young children, and other sick people away from the patient Keep older adults, very young children, and those who have compromised immune systems or chronic health conditions away from the patient. This includes people with chronic heart, lung, or kidney conditions, diabetes, and cancer.  Ensure good ventilation Make sure that shared spaces in the home have good air flow, such as from an air conditioner or an opened window, weather permitting.  Wash your hands often  Wash your hands often and thoroughly with soap and water for at least 20 seconds. You can use an alcohol based hand sanitizer if soap and water are not available and if your hands are not visibly dirty.  Avoid touching your eyes, nose, and mouth with unwashed hands.  Use disposable paper towels to dry your hands. If not available, use dedicated cloth towels and replace them when they become wet.  Wear a facemask and gloves  Wear a disposable facemask at all times in the room and gloves when you touch or have contact with the patient's blood, body fluids, and/or secretions or excretions, such as sweat, saliva, sputum, nasal mucus, vomit, urine, or feces.  Ensure  the mask fits over your nose and mouth tightly, and do not touch it during use.  Throw out disposable facemasks and gloves after using them. Do not reuse.  Wash your hands immediately after removing your facemask and gloves.  If your personal clothing becomes contaminated, carefully remove clothing and launder. Wash your hands after handling contaminated clothing.  Place all used disposable facemasks, gloves, and other waste in a lined container before disposing them with other household waste.  Remove gloves and wash your hands immediately after handling these items.  Do not share dishes, glasses, or other household items with the patient  Avoid sharing household items. You should not share dishes, drinking glasses, cups, eating utensils, towels, bedding, or other items with a patient who is confirmed to have, or being evaluated for, COVID-19 infection.  After the person uses these items, you should wash them thoroughly with soap and water.  Wash laundry thoroughly  Immediately remove and wash clothes or bedding that have blood, body fluids, and/or secretions or excretions, such as sweat, saliva, sputum, nasal mucus, vomit, urine, or feces, on them.  Wear gloves when handling laundry from the patient.  Read and follow directions on labels of laundry or clothing items and detergent. In  general, wash and dry with the warmest temperatures recommended on the label.  Clean all areas the individual has used often  Clean all touchable surfaces, such as counters, tabletops, doorknobs, bathroom fixtures, toilets, phones, keyboards, tablets, and bedside tables, every day. Also, clean any surfaces that may have blood, body fluids, and/or secretions or excretions on them.  Wear gloves when cleaning surfaces the patient has come in contact with.  Use a diluted bleach solution (e.g., dilute bleach with 1 part bleach and 10 parts water) or a household disinfectant with a label that says  EPA-registered for coronaviruses. To make a bleach solution at home, add 1 tablespoon of bleach to 1 quart (4 cups) of water. For a larger supply, add  cup of bleach to 1 gallon (16 cups) of water.  Read labels of cleaning products and follow recommendations provided on product labels. Labels contain instructions for safe and effective use of the cleaning product including precautions you should take when applying the product, such as wearing gloves or eye protection and making sure you have good ventilation during use of the product.  Remove gloves and wash hands immediately after cleaning.  Monitor yourself for signs and symptoms of illness Caregivers and household members are considered close contacts, should monitor their health, and will be asked to limit movement outside of the home to the extent possible. Follow the monitoring steps for close contacts listed on the symptom monitoring form.   ? If you have additional questions, contact your local health department or call the epidemiologist on call at [phone] (available 24/7). ? This guidance is subject to change. For the most up-to-date guidance from [facility], please refer to their website: [email or web address]

## 2018-07-13 ENCOUNTER — Encounter: Payer: Self-pay | Admitting: Family Medicine

## 2018-07-13 MED ORDER — LISINOPRIL 10 MG PO TABS: 10.0000 mg | ORAL_TABLET | Freq: Every day | ORAL | 3 refills | Status: AC

## 2018-08-01 ENCOUNTER — Other Ambulatory Visit: Payer: Self-pay | Admitting: Physician Assistant

## 2018-08-02 ENCOUNTER — Telehealth: Payer: Managed Care, Other (non HMO) | Admitting: Physician Assistant

## 2018-08-02 DIAGNOSIS — R21 Rash and other nonspecific skin eruption: Secondary | ICD-10-CM

## 2018-08-02 MED ORDER — CEPHALEXIN 500 MG PO CAPS: 500.0000 mg | ORAL_CAPSULE | Freq: Three times a day (TID) | ORAL | 0 refills | Status: DC

## 2018-08-02 NOTE — Progress Notes (Signed)
Hello Christian Ortiz,  Thank you for taking my call today.Initially I was concerned about the possibility of a blood clot in your leg however given you are taking Xarelto that would be an unlikely diagnosis.On you to start you on an antibiotic as I think you probably have an early cellulitis.Of call this into the pharmacy.If at any point between today and tomorrow the leg seems to be getting drastically worse and wants to go to the emergency room for full evaluation.Please call your doctor tomorrow to see if you can be seen sometime early this coming week.Have a great day.  Philis Fendt, PA-C   ----- Message -----  From: Christian Ortiz  Sent: 08/02/2018 12:54 PM EDT  To: E-Visit Mailing List Subject: E-Visit Submission: Rash  E-Visit Submission: Rash --------------------------------  Question: Where is your rash located? (check all that apply) Answer: Legs or arms  Question: Is the rash is an area that tends to be moist or sweaty? Answer: No  Question: Rash location "other"/comments: Answer: the skin on the right lower leg is swollen and reddish. I don't feel heat and we marked where it was around 11. It has moved up 1/4 inch since then. It goes around to the back but is not as widespread as in the front. I am on fluid pills.  Question: Are you able to attach a photo? Please do if possible Answer: Yes  Question: Do you have a fever? Answer: No  Question: Does your rash itch? Answer: No  Question: Did the rash appear after shaving or other skin irritation? Answer: No  Question: Is the rash painful? Answer: No  Question: When did your symptoms start? Answer: 1-5 days ago  Question: Did your rash appear after sun exposure? Answer: No  Question: Have you had a tick bite in the last 2 weeks? Answer: No  Question: Did your rash appear shortly after eating a new food? Answer: No  Question: Other new products  used/comments: Answer:   Question: Have you tried any of the following over-the-counter medications? Answer: No  Question: Over the counter med "other"/comments: Answer: none  Question: Did any of the medications help? Answer: No  Question: Are you experiencing any shortness of breath? Answer: No  Question: Please list your medication allergies that you may have ? (If 'none' , please list as 'none') Answer: none  Question: Please list any additional comments  Answer: none  A total of 5-10 minutes was spent evaluating this patients questionnaire and formulating a plan of care.

## 2018-08-03 ENCOUNTER — Ambulatory Visit (INDEPENDENT_AMBULATORY_CARE_PROVIDER_SITE_OTHER): Payer: Managed Care, Other (non HMO) | Admitting: Family Medicine

## 2018-08-03 ENCOUNTER — Encounter: Payer: Self-pay | Admitting: Family Medicine

## 2018-08-03 VITALS — BP 121/67 | HR 72 | Temp 98.0°F | Wt 222.0 lb

## 2018-08-03 DIAGNOSIS — I482 Chronic atrial fibrillation, unspecified: Secondary | ICD-10-CM | POA: Diagnosis not present

## 2018-08-03 DIAGNOSIS — I10 Essential (primary) hypertension: Secondary | ICD-10-CM | POA: Diagnosis not present

## 2018-08-03 DIAGNOSIS — I872 Venous insufficiency (chronic) (peripheral): Secondary | ICD-10-CM

## 2018-08-03 DIAGNOSIS — I428 Other cardiomyopathies: Secondary | ICD-10-CM | POA: Diagnosis not present

## 2018-08-03 DIAGNOSIS — R399 Unspecified symptoms and signs involving the genitourinary system: Secondary | ICD-10-CM

## 2018-08-03 DIAGNOSIS — R972 Elevated prostate specific antigen [PSA]: Secondary | ICD-10-CM

## 2018-08-03 DIAGNOSIS — I480 Paroxysmal atrial fibrillation: Secondary | ICD-10-CM

## 2018-08-03 DIAGNOSIS — I5022 Chronic systolic (congestive) heart failure: Secondary | ICD-10-CM

## 2018-08-03 MED ORDER — TRIAMCINOLONE ACETONIDE 0.5 % EX CREA: 1.0000 "application " | TOPICAL_CREAM | Freq: Two times a day (BID) | CUTANEOUS | 3 refills | Status: DC

## 2018-08-03 NOTE — Progress Notes (Signed)
Christian Ortiz is a 68 y.o. male who presents to [facility]: [place] today for right leg rash. Gene developed a mild rash on his right leg.  He notes it is mildly itchy.  He notes he is having a lot of bilateral leg swelling recently and at the end of the day with a lot of standing his legs feel a bit sore and he is developed a rash of the right leg.  He notes when he uses compression stockings the swelling typically is well controlled.  He notes the swelling is equal bilateral lower extremities.  He denies any fevers chills chest pain palpitation shortness of breath orthopnea.  He had an E-visit who was concerned for possible DVT and recommended evaluation in person.   ROS as above:  Exam:  BP 121/67   Pulse 72   Temp 98 F (36.7 C) (Oral)   Wt 222 lb (100.7 kg)   BMI 30.11 kg/m   Wt Readings from Last 5 Encounters:  08/03/18 222 lb (100.7 kg)  06/29/18 224 lb (101.6 kg)  03/24/18 229 lb (103.9 kg)  02/10/18 222 lb (100.7 kg)  12/22/17 223 lb (101.2 kg)    Gen: Well NAD nontoxic no acute distress HEENT: EOMI,  MMM Lungs: Normal work of breathing. CTABL Heart: MRG Abd: NABS, Soft. Nondistended, Nontender Exts: Brisk capillary refill, warm and well perfused.  Calf diameters are equal bilaterally.  Trace pitting edema bilateral lower extremities present. Skin: Faint erythematous macular rash right shin.  Nontender blanchable.    Assessment and Plan: 68 y.o. 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  Currently the swelling is typically well controlled but patient notes it does worsen if he has been doing prolonged standing.  I think it is reasonable to do a bit of an evaluation to make sure renal function has not worsened to explain some of the swelling.  Additionally will try treatment with triamcinolone cream for possible contact dermatitis  as well as compression stockings for edema and venous stasis dermatitis.  Patient is also due for recheck lipids and PSA.  DVT very unlikely given symmetrical lower extremities.  Patient also currently anticoagulated with Eliquis.  PDMP not reviewed this encounter. Orders Placed This Encounter  Procedures  . CBC  . COMPLETE METABOLIC PANEL WITH GFR  . LDL cholesterol, direct  . PSA   Meds ordered this encounter  Medications  . triamcinolone cream (KENALOG) 0.5 %    Sig: Apply 1 application topically 2 (two) times daily. To affected areas.    Dispense:  30 g    Refill:  3     Historical information moved to improve visibility of documentation.  Past Medical History:  Diagnosis Date  . Carpal tunnel syndrome    Bilateral  . Chronic atrial fibrillation 10/01/2017  . Chronic obstructive pulmonary disease ([place]) 01/13/2014   Overview:  Mild.  [facility] is following.  . Chronic systolic heart failure ([place])   . Elevated PSA 09/21/2015  . Essential hypertension 04/25/2014  . Hypertension, essential 10/01/2017  . Non-rheumatic mitral regurgitation 07/23/2017  . Noncompliance 08/01/2017  . Nonischemic cardiomyopathy ([place]) 07/23/2017  . Nonrheumatic mitral valve regurgitation 10/01/2017  . Obesity (BMI 30-39.9) 02/15/2015  . OSA (obstructive sleep apnea) 05/10/2014  . Persistent atrial fibrillation   . Prediabetes 09/27/2015  . S/P laparoscopic appendectomy 02/15/2013  . Sleep apnea  DECIDED AGAINST CPAP 10/01/2017  . Ulnar neuropathy    Past Surgical History:  Procedure Laterality Date  . APPENDECTOMY  02/15/2013  . ATRIAL FIBRILLATION ABLATION N/A 11/13/2017   Procedure: ATRIAL FIBRILLATION ABLATION;  Surgeon: Constance Haw, MD;  Location: [facility];  Service: Cardiovascular;  Laterality: N/A;  . sessile colonic polyp N/A 02/15/2013   Social History   Tobacco Use  . Smoking status: Never Smoker  . Smokeless tobacco: Never Used  Substance  Use Topics  . Alcohol use: No   family history includes COPD in his father; Cancer in his mother; Diabetes in his father; Hypertension in his father.  Medications: Current Outpatient Medications  Medication Sig Dispense Refill  . atorvastatin (LIPITOR) 80 MG tablet Take 1 tablet (80 mg total) by mouth daily. 30 tablet 6  . diclofenac sodium (VOLTAREN) 1 % GEL Apply 2 g topically 4 (four) times daily. To affected joint. 100 g 11  . fluticasone (FLONASE) 50 MCG/ACT nasal spray Place 2 sprays into both nostrils daily. 16 g 12  . Fluticasone-Salmeterol (ADVAIR) 250-50 MCG/DOSE AEPB Inhale 1 puff into the lungs 2 (two) times daily.    . furosemide (LASIX) 40 MG tablet TAKE 1 TABLET BY MOUTH EVERY DAY 30 tablet 1  . lisinopril (ZESTRIL) 10 MG tablet Take 1 tablet (10 mg total) by mouth daily. 30 tablet 3  . loratadine (CLARITIN) 10 MG tablet Take 10 mg by mouth daily.    . metoprolol succinate (TOPROL-XL) 50 MG 24 hr tablet Take 50 mg by mouth daily. Take with or immediately following a meal.    . rivaroxaban (XARELTO) 20 MG TABS tablet Take 20 mg by mouth daily.    Marland Kitchen triamcinolone cream (KENALOG) 0.5 % Apply 1 application topically 2 (two) times daily. To affected areas. 30 g 3   No current facility-administered medications for this visit.    No Known Allergies   Discussed warning signs or symptoms. Please see discharge instructions. Patient expresses understanding.

## 2018-08-03 NOTE — Patient Instructions (Signed)
Thank you for coming in today. Use compression stockings to keep the swelling down.  Use the cream on the redness.  Get labs now.  Keep me updated.  We can also use fluid pills but that will make you pee and and dehydrated in the heat.  Compression works better.    Venus Stasis Dermatitis.

## 2018-08-04 LAB — CBC
HCT: 40.7 % (ref 38.5–50.0)
Hemoglobin: 13.8 g/dL (ref 13.2–17.1)
MCH: 31.7 pg (ref 27.0–33.0)
MCHC: 33.9 g/dL (ref 32.0–36.0)
MCV: 93.3 fL (ref 80.0–100.0)
MPV: 10.6 fL (ref 7.5–12.5)
Platelets: 255 10*3/uL (ref 140–400)
RBC: 4.36 10*6/uL (ref 4.20–5.80)
RDW: 12.2 % (ref 11.0–15.0)
WBC: 6.6 10*3/uL (ref 3.8–10.8)

## 2018-08-04 LAB — COMPLETE METABOLIC PANEL WITH GFR
AG Ratio: 1.6 (calc) (ref 1.0–2.5)
ALT: 17 U/L (ref 9–46)
AST: 22 U/L (ref 10–35)
Albumin: 4.1 g/dL (ref 3.6–5.1)
Alkaline phosphatase (APISO): 72 U/L (ref 35–144)
BUN: 21 mg/dL (ref 7–25)
CO2: 25 mmol/L (ref 20–32)
Calcium: 9 mg/dL (ref 8.6–10.3)
Chloride: 105 mmol/L (ref 98–110)
Creat: 0.94 mg/dL (ref 0.70–1.25)
GFR, Est African American: 98 mL/min/{1.73_m2} (ref >=60)
GFR, Est Non African American: 85 mL/min/{1.73_m2} (ref >=60)
Globulin: 2.5 g/dL (calc) (ref 1.9–3.7)
Glucose, Bld: 115 mg/dL (ref 65–139)
Potassium: 3.8 mmol/L (ref 3.5–5.3)
Sodium: 140 mmol/L (ref 135–146)
Total Bilirubin: 0.6 mg/dL (ref 0.2–1.2)
Total Protein: 6.6 g/dL (ref 6.1–8.1)

## 2018-08-04 LAB — PSA: PSA: 4.9 ng/mL — ABNORMAL HIGH (ref <=4.0)

## 2018-08-04 LAB — LDL CHOLESTEROL, DIRECT: Direct LDL: 70 mg/dL (ref <100)

## 2018-08-13 ENCOUNTER — Telehealth: Payer: Self-pay | Admitting: General Practice

## 2018-08-13 ENCOUNTER — Encounter: Payer: Self-pay | Admitting: Family Medicine

## 2018-08-13 ENCOUNTER — Telehealth (INDEPENDENT_AMBULATORY_CARE_PROVIDER_SITE_OTHER): Payer: Managed Care, Other (non HMO) | Admitting: Family Medicine

## 2018-08-13 ENCOUNTER — Telehealth: Payer: Self-pay | Admitting: *Deleted

## 2018-08-13 ENCOUNTER — Encounter (INDEPENDENT_AMBULATORY_CARE_PROVIDER_SITE_OTHER): Payer: Self-pay

## 2018-08-13 ENCOUNTER — Other Ambulatory Visit: Payer: Self-pay

## 2018-08-13 VITALS — BP 137/80 | HR 72 | Temp 95.8°F | Wt 223.0 lb

## 2018-08-13 DIAGNOSIS — Z20822 Contact with and (suspected) exposure to covid-19: Secondary | ICD-10-CM

## 2018-08-13 DIAGNOSIS — R6889 Other general symptoms and signs: Secondary | ICD-10-CM

## 2018-08-13 DIAGNOSIS — R519 Headache, unspecified: Secondary | ICD-10-CM

## 2018-08-13 DIAGNOSIS — R51 Headache: Secondary | ICD-10-CM

## 2018-08-13 NOTE — Telephone Encounter (Signed)
-----   Message from Gregor Hams, MD sent at 08/13/2018 10:13 AM EDT ----- Regarding: COVID testing Please arrange for COVID PCR nasal swab. Higher risk with headache and fatigue, Works for [employer].

## 2018-08-13 NOTE — Telephone Encounter (Signed)
Attempted to contact pt due to my chart companion response of cough; left message on voicemail

## 2018-08-13 NOTE — Patient Instructions (Signed)
Thank you for coming in today. Call or go to the emergency room if you get worse, have trouble breathing, have chest pains, or palpitations.  Go to the emergency room if your headache becomes excruciating or you have weakness or numbness or uncontrolled vomiting.       Person Under Monitoring Name: Christian Ortiz  Location: [place] [address]   Infection Prevention Recommendations for Individuals Confirmed to have, or Being Evaluated for, 2019 Novel Coronavirus (COVID-19) Infection Who Receive Care at Home  Individuals who are confirmed to have, or are being evaluated for, COVID-19 should follow the prevention steps below until a healthcare provider or local or state health department says they can return to normal activities.  Stay home except to get medical care You should restrict activities outside your home, except for getting medical care. Do not go to work, school, or public areas, and do not use public transportation or taxis.  Call ahead before visiting your doctor Before your medical appointment, call the healthcare provider and tell them that you have, or are being evaluated for, COVID-19 infection. This will help the healthcare provider's office take steps to keep other people from getting infected. Ask your healthcare provider to call the local or state health department.  Monitor your symptoms Seek prompt medical attention if your illness is worsening (e.g., difficulty breathing). Before going to your medical appointment, call the healthcare provider and tell them that you have, or are being evaluated for, COVID-19 infection. Ask your healthcare provider to call the local or state health department.  Wear a facemask You should wear a facemask that covers your nose and mouth when you are in the same room with other people and when you visit a healthcare provider. People who live with or visit you should also wear a facemask while they are in the  same room with you.  Separate yourself from other people in your home As much as possible, you should stay in a different room from other people in your home. Also, you should use a separate bathroom, if available.  Avoid sharing household items You should not share dishes, drinking glasses, cups, eating utensils, towels, bedding, or other items with other people in your home. After using these items, you should wash them thoroughly with soap and water.  Cover your coughs and sneezes Cover your mouth and nose with a tissue when you cough or sneeze, or you can cough or sneeze into your sleeve. Throw used tissues in a lined trash can, and immediately wash your hands with soap and water for at least 20 seconds or use an alcohol-based hand rub.  Wash your [facility] your hands often and thoroughly with soap and water for at least 20 seconds. You can use an alcohol-based hand sanitizer if soap and water are not available and if your hands are not visibly dirty. Avoid touching your eyes, nose, and mouth with unwashed hands.   Prevention Steps for Caregivers and Household Members of Individuals Confirmed to have, or Being Evaluated for, COVID-19 Infection Being Cared for in the Home  If you live with, or provide care at home for, a person confirmed to have, or being evaluated for, COVID-19 infection please follow these guidelines to prevent infection:  Follow healthcare provider's instructions Make sure that you understand and can help the patient follow any healthcare provider instructions for all care.  Provide for the patient's basic needs You should help the patient with basic needs in  the home and provide support for getting groceries, prescriptions, and other personal needs.  Monitor the patient's symptoms If they are getting sicker, call his or her medical provider and tell them that the patient has, or is being evaluated for, COVID-19 infection. This will help the healthcare  provider's office take steps to keep other people from getting infected. Ask the healthcare provider to call the local or state health department.  Limit the number of people who have contact with the patient  If possible, have only one caregiver for the patient.  Other household members should stay in another home or place of residence. If this is not possible, they should stay  in another room, or be separated from the patient as much as possible. Use a separate bathroom, if available.  Restrict visitors who do not have an essential need to be in the home.  Keep older adults, very young children, and other sick people away from the patient Keep older adults, very young children, and those who have compromised immune systems or chronic health conditions away from the patient. This includes people with chronic heart, lung, or kidney conditions, diabetes, and cancer.  Ensure good ventilation Make sure that shared spaces in the home have good air flow, such as from an air conditioner or an opened window, weather permitting.  Wash your hands often  Wash your hands often and thoroughly with soap and water for at least 20 seconds. You can use an alcohol based hand sanitizer if soap and water are not available and if your hands are not visibly dirty.  Avoid touching your eyes, nose, and mouth with unwashed hands.  Use disposable paper towels to dry your hands. If not available, use dedicated cloth towels and replace them when they become wet.  Wear a facemask and gloves  Wear a disposable facemask at all times in the room and gloves when you touch or have contact with the patient's blood, body fluids, and/or secretions or excretions, such as sweat, saliva, sputum, nasal mucus, vomit, urine, or feces.  Ensure the mask fits over your nose and mouth tightly, and do not touch it during use.  Throw out disposable facemasks and gloves after using them. Do not reuse.  Wash your hands immediately  after removing your facemask and gloves.  If your personal clothing becomes contaminated, carefully remove clothing and launder. Wash your hands after handling contaminated clothing.  Place all used disposable facemasks, gloves, and other waste in a lined container before disposing them with other household waste.  Remove gloves and wash your hands immediately after handling these items.  Do not share dishes, glasses, or other household items with the patient  Avoid sharing household items. You should not share dishes, drinking glasses, cups, eating utensils, towels, bedding, or other items with a patient who is confirmed to have, or being evaluated for, COVID-19 infection.  After the person uses these items, you should wash them thoroughly with soap and water.  Wash laundry thoroughly  Immediately remove and wash clothes or bedding that have blood, body fluids, and/or secretions or excretions, such as sweat, saliva, sputum, nasal mucus, vomit, urine, or feces, on them.  Wear gloves when handling laundry from the patient.  Read and follow directions on labels of laundry or clothing items and detergent. In general, wash and dry with the warmest temperatures recommended on the label.  Clean all areas the individual has used often  Clean all touchable surfaces, such as counters, tabletops, doorknobs, bathroom fixtures,  toilets, phones, keyboards, tablets, and bedside tables, every day. Also, clean any surfaces that may have blood, body fluids, and/or secretions or excretions on them.  Wear gloves when cleaning surfaces the patient has come in contact with.  Use a diluted bleach solution (e.g., dilute bleach with 1 part bleach and 10 parts water) or a household disinfectant with a label that says EPA-registered for coronaviruses. To make a bleach solution at home, add 1 tablespoon of bleach to 1 quart (4 cups) of water. For a larger supply, add  cup of bleach to 1 gallon (16 cups) of water.   Read labels of cleaning products and follow recommendations provided on product labels. Labels contain instructions for safe and effective use of the cleaning product including precautions you should take when applying the product, such as wearing gloves or eye protection and making sure you have good ventilation during use of the product.  Remove gloves and wash hands immediately after cleaning.  Monitor yourself for signs and symptoms of illness Caregivers and household members are considered close contacts, should monitor their health, and will be asked to limit movement outside of the home to the extent possible. Follow the monitoring steps for close contacts listed on the symptom monitoring form.   ? If you have additional questions, contact your local health department or call the epidemiologist on call at [phone] (available 24/7). ? This guidance is subject to change. For the most up-to-date guidance from [facility], please refer to their website: [email or web address]

## 2018-08-13 NOTE — Telephone Encounter (Signed)
Pt called back to discuss symptoms of new cough; recommendations given: * Sore throat: Try throat lozenges, hard candy or warm chicken broth.    7: COUGH MEDICINES:  * OTC COUGH SYRUPS: The most common cough suppressant in OTC cough medications is dextromethorphan. Often the letters 'DM' appear in the name.  * OTC COUGH DROPS: Cough drops can help a lot, especially for mild coughs. They reduce coughing by soothing your irritated throat and removing that tickle sensation in the back of the throat. Cough drops also have the advantage of portability - you can carry them with you.  * HOME REMEDY - HARD CANDY: Hard candy works just as well as medicine-flavored OTC cough drops. People who have diabetes should use sugar-free candy.  * HOME REMEDY - HONEY: This old home remedy has been shown to help decrease coughing at night. The adult dosage is 2 teaspoons (10 ml) at bedtime. Honey should not be given to infants under one year of age.   8: CAUTION - DEXTROMETHORPHAN:  * Do not try to completely suppress coughs that produce mucus and phlegm. Remember that coughing is helpful in bringing up mucus from the lungs and preventing pneumonia.  * Research Notes: Dextromethorphan in some research studies has been shown to reduce the frequency and severity of cough in adults (18 years or older) without significant adverse effects. However, other studies suggest that dextromethorphan is no better than placebo at reducing a cough.    Pt also advised to contact his PCP for further recommendations; he verbalized understanding.

## 2018-08-13 NOTE — Progress Notes (Signed)
Virtual Visit  via Video Note  I connected with      Christian Ortiz by a video enabled telemedicine application and verified that I am speaking with the correct person using two identifiers.   I discussed the limitations of evaluation and management by telemedicine and the availability of in person appointments. The patient expressed understanding and agreed to proceed.  History of Present Illness: Christian Ortiz is a 68 y.o. male who would like to discuss headache yesterday and today. Patient also notes fatigue. No fever. No sick contacts that patient knows about. No tick bites.  No new weakness, new numbness or loss of function. No vision change.  Ibuprofen helped some.   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        Observations/Objective: BP 137/80   Pulse 72   Temp (!) 95.8 F (35.4 C) (Oral)   Wt 223 lb (101.2 kg)   BMI 30.24 kg/m  Wt Readings from Last 5 Encounters:  08/13/18 223 lb (101.2 kg)  08/03/18 222 lb (100.7 kg)  06/29/18 224 lb (101.6 kg)  03/24/18 229 lb (103.9 kg)  02/10/18 222 lb (100.7 kg)   Exam: Appearance nontoxic no acute distress Normal Speech.  Normal coordination.  Face is symmetrical.  No tachypnea or wheezing.  Moves upper extremities normally.    Assessment and Plan: 68 y.o. male with mild headache and mild fatigue.  Etiology is unclear however patient does not appear to be significantly ill per video visit today.  However he does work at [employer] and is exposed to multiple coworkers.  His risk of contracting COVID-19 is relatively high.  He does not have any known contacts and there is no sick employees at [employer] that he is aware of.  However given his situation I think is reasonable to proceed with COVID-19 testing.  Recommend patient abstain from work and proceed with self-isolation per CDC guidelines until test results are negative.  Additionally discussed precautions for headache management.  Okay to use Tylenol.  Recommend  Tylenol over ibuprofen for safety profile especially with Eliquis.  PDMP not reviewed this encounter. No orders of the defined types were placed in this encounter.  No orders of the defined types were placed in this encounter.   Follow Up Instructions:    I discussed the assessment and treatment plan with the patient. The patient was provided an opportunity to ask questions and all were answered. The patient agreed with the plan and demonstrated an understanding of the instructions.   The patient was advised to call back or seek an in-person evaluation if the symptoms worsen or if the condition fails to improve as anticipated.  Time: 15 minutes of intraservice time, with >22 minutes of total time during today's visit.      Historical information moved to improve visibility of documentation.  Past Medical History:  Diagnosis Date  . Carpal tunnel syndrome    Bilateral  . Chronic atrial fibrillation 10/01/2017  . Chronic obstructive pulmonary disease ([place]) 01/13/2014   Overview:  Mild.  [facility] is following.  . Chronic systolic heart failure ([place])   . Elevated PSA 09/21/2015  . Essential hypertension 04/25/2014  . Hypertension, essential 10/01/2017  . Non-rheumatic mitral regurgitation 07/23/2017  . Noncompliance 08/01/2017  . Nonischemic cardiomyopathy ([place]) 07/23/2017  . Nonrheumatic mitral valve regurgitation 10/01/2017  . Obesity (BMI 30-39.9) 02/15/2015  . OSA (obstructive sleep apnea) 05/10/2014  . Persistent atrial fibrillation   . Prediabetes 09/27/2015  .  S/P laparoscopic appendectomy 02/15/2013  . Sleep apnea                          DECIDED AGAINST CPAP 10/01/2017  . Ulnar neuropathy    Past Surgical History:  Procedure Laterality Date  . APPENDECTOMY  02/15/2013  . ATRIAL FIBRILLATION ABLATION N/A 11/13/2017   Procedure: ATRIAL FIBRILLATION ABLATION;  Surgeon: Constance Haw, MD;  Location: [facility];  Service: Cardiovascular;  Laterality: N/A;  .  sessile colonic polyp N/A 02/15/2013   Social History   Tobacco Use  . Smoking status: Never Smoker  . Smokeless tobacco: Never Used  Substance Use Topics  . Alcohol use: No   family history includes COPD in his father; Cancer in his mother; Diabetes in his father; Hypertension in his father.  Medications: Current Outpatient Medications  Medication Sig Dispense Refill  . atorvastatin (LIPITOR) 80 MG tablet TAKE 1 TABLET EVERY DAY 30 tablet 5  . diclofenac sodium (VOLTAREN) 1 % GEL Apply 2 g topically 4 (four) times daily. To affected joint. 100 g 11  . fluticasone (FLONASE) 50 MCG/ACT nasal spray Place 2 sprays into both nostrils daily. 16 g 12  . Fluticasone-Salmeterol (ADVAIR) 250-50 MCG/DOSE AEPB Inhale 1 puff into the lungs 2 (two) times daily.    . furosemide (LASIX) 40 MG tablet TAKE 1 TABLET BY MOUTH EVERY DAY 30 tablet 1  . lisinopril (ZESTRIL) 10 MG tablet Take 1 tablet (10 mg total) by mouth daily. 30 tablet 3  . loratadine (CLARITIN) 10 MG tablet Take 10 mg by mouth daily.    . metoprolol succinate (TOPROL-XL) 50 MG 24 hr tablet Take 50 mg by mouth daily. Take with or immediately following a meal.    . rivaroxaban (XARELTO) 20 MG TABS tablet Take 20 mg by mouth daily.    Marland Kitchen triamcinolone cream (KENALOG) 0.5 % Apply 1 application topically 2 (two) times daily. To affected areas. 30 g 3   No current facility-administered medications for this visit.    No Known Allergies

## 2018-08-13 NOTE — Addendum Note (Signed)
Addended by: Denman George on: 08/13/2018 11:23 AM   Modules accepted: Orders

## 2018-08-13 NOTE — Telephone Encounter (Signed)
Pt returned my call to schedule testing. Pt has been scheduled for [facility] location.   Pt was referred RH:[id number], Rebekah [place], MD

## 2018-08-13 NOTE — Telephone Encounter (Signed)
lvm for pt to return call for covid testing.

## 2018-08-14 ENCOUNTER — Encounter (INDEPENDENT_AMBULATORY_CARE_PROVIDER_SITE_OTHER): Payer: Self-pay

## 2018-08-15 ENCOUNTER — Encounter (INDEPENDENT_AMBULATORY_CARE_PROVIDER_SITE_OTHER): Payer: Self-pay

## 2018-08-15 LAB — NOVEL CORONAVIRUS, NAA: SARS-CoV-2, NAA: NOT DETECTED

## 2018-08-16 ENCOUNTER — Encounter (INDEPENDENT_AMBULATORY_CARE_PROVIDER_SITE_OTHER): Payer: Self-pay

## 2018-08-18 ENCOUNTER — Encounter (INDEPENDENT_AMBULATORY_CARE_PROVIDER_SITE_OTHER): Payer: Self-pay

## 2018-08-19 ENCOUNTER — Encounter (INDEPENDENT_AMBULATORY_CARE_PROVIDER_SITE_OTHER): Payer: Self-pay

## 2018-08-20 ENCOUNTER — Encounter (INDEPENDENT_AMBULATORY_CARE_PROVIDER_SITE_OTHER): Payer: Self-pay

## 2018-08-23 ENCOUNTER — Encounter (INDEPENDENT_AMBULATORY_CARE_PROVIDER_SITE_OTHER): Payer: Self-pay

## 2018-08-24 ENCOUNTER — Ambulatory Visit (INDEPENDENT_AMBULATORY_CARE_PROVIDER_SITE_OTHER): Payer: Managed Care, Other (non HMO) | Admitting: Family Medicine

## 2018-08-24 ENCOUNTER — Telehealth: Payer: Self-pay | Admitting: *Deleted

## 2018-08-24 ENCOUNTER — Encounter (INDEPENDENT_AMBULATORY_CARE_PROVIDER_SITE_OTHER): Payer: Self-pay

## 2018-08-24 ENCOUNTER — Encounter: Payer: Self-pay | Admitting: Family Medicine

## 2018-08-24 VITALS — BP 126/85 | HR 86 | Temp 98.5°F | Wt 222.0 lb

## 2018-08-24 DIAGNOSIS — I428 Other cardiomyopathies: Secondary | ICD-10-CM

## 2018-08-24 DIAGNOSIS — I5022 Chronic systolic (congestive) heart failure: Secondary | ICD-10-CM

## 2018-08-24 DIAGNOSIS — J42 Unspecified chronic bronchitis: Secondary | ICD-10-CM

## 2018-08-24 DIAGNOSIS — R972 Elevated prostate specific antigen [PSA]: Secondary | ICD-10-CM

## 2018-08-24 DIAGNOSIS — Z1211 Encounter for screening for malignant neoplasm of colon: Secondary | ICD-10-CM

## 2018-08-24 DIAGNOSIS — I10 Essential (primary) hypertension: Secondary | ICD-10-CM | POA: Diagnosis not present

## 2018-08-24 DIAGNOSIS — K635 Polyp of colon: Secondary | ICD-10-CM | POA: Diagnosis not present

## 2018-08-24 DIAGNOSIS — I480 Paroxysmal atrial fibrillation: Secondary | ICD-10-CM

## 2018-08-24 NOTE — Telephone Encounter (Signed)
Attempted to contact pt due to my chart companion response of new cough on 08/24/2018; left message on voicemail.

## 2018-08-24 NOTE — Patient Instructions (Addendum)
Thank you for coming in today. Continue current medicines.  Recheck sooner if needed.   If all is well check back in 6 months or sooner if needed.   Follow up with urology about elevated PSA.   Schedule high dose senior flu shot this fall in later September.  Schedule nurse visit.   You should hear from Dr C at [facility] about colon cancer screening.

## 2018-08-24 NOTE — Telephone Encounter (Signed)
Attempted to contact pt; pt seen in MD's office 08/24/2018.

## 2018-08-24 NOTE — Progress Notes (Signed)
Christian Ortiz is a 68 y.o. male who presents to [facility]: [facility] today for follow-up hypertension and venous stasis dermatitis, as well as elevated PSA and colon cancer screening.  He takes medications listed below for hypertension and feels well.  He notes his blood pressure is typically well managed.  No lightheadedness or dizziness.  Additionally he has been using compression stockings for venous stasis dermatitis along with triamcinolone cream which works quite well.  He also is due for 5-year recheck colon cancer screening.  Had polyps on his last scan in 2014.  He had trouble getting the scan approved at [facility] and would like to switch to other group who is more willing to work with him.  He also had a mildly elevated PSA at his last check.  He is asymptomatic and has management with urology.  He cannot think of the next appointment he has with urology.  Lastly he notes that he had a little cough this morning.  He mowed his lawn yesterday and developed a little runny nose and cough.  He is feeling a lot better today and denies any fevers or chills.  He be had a recently negative COVID-19 PCR test.    ROS as above:  Exam:  BP 126/85   Pulse 86   Temp 98.5 F (36.9 C) (Oral)   Wt 222 lb (100.7 kg)   BMI 30.11 kg/m  Wt Readings from Last 5 Encounters:  08/24/18 222 lb (100.7 kg)  08/13/18 223 lb (101.2 kg)  08/03/18 222 lb (100.7 kg)  06/29/18 224 lb (101.6 kg)  03/24/18 229 lb (103.9 kg)    Gen: Well NAD nontoxic appearing HEENT: EOMI,  MMM Lungs: Normal work of breathing. CTABL Heart: RRR no MRG Abd: NABS, Soft. Nondistended, Nontender Exts: Brisk capillary refill, warm and well perfused.  Trace edema bilateral lower extremities.  No erythema.   Lab and Radiology Results Recent Results (from the past 2160 hour(s))  CBC     Status: None    Collection Time: 08/03/18  4:21 PM  Result Value Ref Range   WBC 6.6 3.8 - 10.8 Thousand/uL   RBC 4.36 4.20 - 5.80 Million/uL   Hemoglobin 13.8 13.2 - 17.1 g/dL   HCT 40.7 38.5 - 50.0 %   MCV 93.3 80.0 - 100.0 fL   MCH 31.7 27.0 - 33.0 pg   MCHC 33.9 32.0 - 36.0 g/dL   RDW 12.2 11.0 - 15.0 %   Platelets 255 140 - 400 Thousand/uL   MPV 10.6 7.5 - 12.5 fL  COMPLETE METABOLIC PANEL WITH GFR     Status: None   Collection Time: 08/03/18  4:21 PM  Result Value Ref Range   Glucose, Bld 115 65 - 139 mg/dL    Comment: .        Non-fasting reference interval .    BUN 21 7 - 25 mg/dL   Creat 0.94 0.70 - 1.25 mg/dL    Comment: For patients >45 years of age, the reference limit for Creatinine is approximately 13% higher for people identified as African-American. .    GFR, Est Non African American 85 > OR = 60 mL/min/1.44m2   GFR, Est African American 98 > OR = 60 mL/min/1.58m2   BUN/Creatinine Ratio NOT APPLICABLE 6 - 22 (calc)   Sodium 140 135 - 146 mmol/L   Potassium 3.8 3.5 - 5.3 mmol/L   Chloride 105 98 - 110 mmol/L  CO2 25 20 - 32 mmol/L   Calcium 9.0 8.6 - 10.3 mg/dL   Total Protein 6.6 6.1 - 8.1 g/dL   Albumin 4.1 3.6 - 5.1 g/dL   Globulin 2.5 1.9 - 3.7 g/dL (calc)   AG Ratio 1.6 1.0 - 2.5 (calc)   Total Bilirubin 0.6 0.2 - 1.2 mg/dL   Alkaline phosphatase (APISO) 72 35 - 144 U/L   AST 22 10 - 35 U/L   ALT 17 9 - 46 U/L  LDL cholesterol, direct     Status: None   Collection Time: 08/03/18  4:21 PM  Result Value Ref Range   Direct LDL 70 <100 mg/dL    Comment: Greatly elevated Triglycerides values (>1200 mg/dL) interfere with the dLDL assay. As no Triglycerides  testing was ordered, interpret results with caution. . Desirable range <100 mg/dL for primary prevention;   <70 mg/dL for patients with CHD or diabetic patients  with > or = 2 CHD risk factors. .   PSA     Status: Abnormal   Collection Time: 08/03/18  4:21 PM  Result Value Ref Range   PSA 4.9 (H) < OR =  4.0 ng/mL    Comment: The total PSA value from this assay system is  standardized against the WHO standard. The test  result will be approximately 20% lower when compared  to the equimolar-standardized total PSA (Beckman  Coulter). Comparison of serial PSA results should be  interpreted with this fact in mind. . This test was performed using the Siemens  chemiluminescent method. Values obtained from  different assay methods cannot be used interchangeably. PSA levels, regardless of value, should not be interpreted as absolute evidence of the presence or absence of disease.   Novel Coronavirus, NAA ([facility])     Status: None   Collection Time: 08/13/18 11:30 AM  Result Value Ref Range   SARS-CoV-2, NAA Not Detected Not Detected    Comment: Testing was performed using the cobas(R) SARS-CoV-2 test. This test was developed and its performance characteristics determined by [employer]. This test has not been FDA cleared or approved. This test has been authorized by FDA under an Emergency Use Authorization (EUA). This test is only authorized for the duration of time the declaration that circumstances exist justifying the authorization of the emergency use of in vitro diagnostic tests for detection of SARS-CoV-2 virus and/or diagnosis of COVID-19 infection under section 564(b)(1) of the Act, 21 U.S.C. 027OZD-6(U)(4), unless the authorization is terminated or revoked sooner. When diagnostic testing is negative, the possibility of a false negative result should be considered in the context of a patient's recent exposures and the presence of clinical signs and symptoms consistent with COVID-19. An individual without symptoms of COVID-19 and who is not shedding SARS-CoV-2 virus would expect to have a negati ve (not detected) result in this assay.       Assessment and Plan: 68 y.o. male with  Hypertension/heart disease.  Doing well.  Continue current regimen.  Elevated PSA:  Recheck with urology..  Repeat colon cancer screening.  Refer back to gastroenterology for repeat colonoscopy.  Patient would like to switch providers.  Will refer to [facility] gastroenterology at [facility].  Cough: Likely allergic.  Watchful waiting.  Over-the-counter medications.  Venous stasis dermatitis well controlled.  Recommend flu vaccine this fall.  Recheck 6 months well adult visit if all is well.  PDMP not reviewed this encounter. Orders Placed This Encounter  Procedures  . Ambulatory referral to Gastroenterology  Referral Priority:   Routine    Referral Type:   Consultation    Referral Reason:   Specialty Services Required    Referred to Provider:   Lavena Bullion, DO    Number of Visits Requested:   1   No orders of the defined types were placed in this encounter.    Historical information moved to improve visibility of documentation.  Past Medical History:  Diagnosis Date  . Carpal tunnel syndrome    Bilateral  . Chronic atrial fibrillation 10/01/2017  . Chronic obstructive pulmonary disease ([place]) 01/13/2014   Overview:  Mild.  [facility] is following.  . Chronic systolic heart failure ([place])   . Elevated PSA 09/21/2015  . Essential hypertension 04/25/2014  . Hypertension, essential 10/01/2017  . Non-rheumatic mitral regurgitation 07/23/2017  . Noncompliance 08/01/2017  . Nonischemic cardiomyopathy ([place]) 07/23/2017  . Nonrheumatic mitral valve regurgitation 10/01/2017  . Obesity (BMI 30-39.9) 02/15/2015  . OSA (obstructive sleep apnea) 05/10/2014  . Persistent atrial fibrillation   . Prediabetes 09/27/2015  . S/P laparoscopic appendectomy 02/15/2013  . Sleep apnea                          DECIDED AGAINST CPAP 10/01/2017  . Ulnar neuropathy    Past Surgical History:  Procedure Laterality Date  . APPENDECTOMY  02/15/2013  . ATRIAL FIBRILLATION ABLATION N/A 11/13/2017   Procedure: ATRIAL FIBRILLATION ABLATION;  Surgeon: Constance Haw, MD;  Location: [facility];  Service: Cardiovascular;  Laterality: N/A;  . sessile colonic polyp N/A 02/15/2013   Social History   Tobacco Use  . Smoking status: Never Smoker  . Smokeless tobacco: Never Used  Substance Use Topics  . Alcohol use: No   family history includes COPD in his father; Cancer in his mother; Diabetes in his father; Hypertension in his father.  Medications: Current Outpatient Medications  Medication Sig Dispense Refill  . atorvastatin (LIPITOR) 80 MG tablet TAKE 1 TABLET EVERY DAY 30 tablet 5  . diclofenac sodium (VOLTAREN) 1 % GEL Apply 2 g topically 4 (four) times daily. To affected joint. 100 g 11  . fluticasone (FLONASE) 50 MCG/ACT nasal spray Place 2 sprays into both nostrils daily. 16 g 12  . Fluticasone-Salmeterol (ADVAIR) 250-50 MCG/DOSE AEPB Inhale 1 puff into the lungs 2 (two) times daily.    . furosemide (LASIX) 40 MG tablet TAKE 1 TABLET BY MOUTH EVERY DAY 30 tablet 1  . lisinopril (ZESTRIL) 10 MG tablet Take 1 tablet (10 mg total) by mouth daily. 30 tablet 3  . loratadine (CLARITIN) 10 MG tablet Take 10 mg by mouth daily.    . metoprolol succinate (TOPROL-XL) 50 MG 24 hr tablet Take 50 mg by mouth daily. Take with or immediately following a meal.    . rivaroxaban (XARELTO) 20 MG TABS tablet Take 20 mg by mouth daily.    Marland Kitchen triamcinolone cream (KENALOG) 0.5 % Apply 1 application topically 2 (two) times daily. To affected areas. 30 g 3   No current facility-administered medications for this visit.    No Known Allergies   Discussed warning signs or symptoms. Please see discharge instructions. Patient expresses understanding.

## 2018-08-30 ENCOUNTER — Other Ambulatory Visit: Payer: Self-pay | Admitting: Family Medicine

## 2018-09-21 ENCOUNTER — Telehealth: Payer: Self-pay

## 2018-09-21 NOTE — Telephone Encounter (Signed)
Patient called reporting he fell outside on Saturday and is having some pain. Patient reports most of the trauma was to his knees, but reports he also "lightly" hit his head on some bricks. Pt on blood thinners. Having sharp/burning pain behind right knee and also knee pain. Denied headaches or other SX.   Patient has appt scheduled for tomorrow at 10"00 due to no opening in office today. Dr Georgina Snell, can this wait? Patient trying to avoid ER at all cost. Can he be worked in with you today?  Please advise.Marland Kitchen

## 2018-09-21 NOTE — Telephone Encounter (Signed)
Pt advised.

## 2018-09-21 NOTE — Telephone Encounter (Signed)
This can probably wait as long as he does not have significant neurological symptoms.  If he develops severe arm more significant neurological symptoms such as headache or dizziness or loss of function then he should go to the emergency room otherwise can wait till tomorrow.

## 2018-09-22 ENCOUNTER — Encounter: Payer: Self-pay | Admitting: Family Medicine

## 2018-09-22 ENCOUNTER — Ambulatory Visit (INDEPENDENT_AMBULATORY_CARE_PROVIDER_SITE_OTHER): Payer: Managed Care, Other (non HMO) | Admitting: Family Medicine

## 2018-09-22 ENCOUNTER — Ambulatory Visit (INDEPENDENT_AMBULATORY_CARE_PROVIDER_SITE_OTHER): Payer: Managed Care, Other (non HMO)

## 2018-09-22 ENCOUNTER — Other Ambulatory Visit: Payer: Self-pay

## 2018-09-22 VITALS — BP 142/91 | HR 86 | Temp 98.2°F | Ht 70.0 in | Wt 228.3 lb

## 2018-09-22 DIAGNOSIS — M25562 Pain in left knee: Secondary | ICD-10-CM

## 2018-09-22 DIAGNOSIS — M25561 Pain in right knee: Secondary | ICD-10-CM

## 2018-09-22 DIAGNOSIS — M170 Bilateral primary osteoarthritis of knee: Secondary | ICD-10-CM

## 2018-09-22 NOTE — Patient Instructions (Addendum)
Thank you for coming in today.  Use the brace as needed.  Call or go to the ER if you develop a large red swollen joint with extreme pain or oozing puss.   Continue diclofenac gel.  Let me know if you get headache or loss of function or weakness or confusion.

## 2018-09-22 NOTE — Progress Notes (Signed)
Christian Ortiz is a 68 y.o. male who presents to [place] [facility] today for knee pain after fall.  Patient tripped and fell Saturday at home.  He landed on his left knee but notes pain in both knees.  He has existing diagnosis of DJD bilaterally.  He is received steroid injections in the past which have helped.  Additionally he uses diclofenac gel intermittently as well for knee pain which does help.  His pain is located primarily at the posterior aspect of his knees.  He denies any locking or catching or giving way.  He notes that he may have bumped his head a little bit during the fall.  He denies any bruising or severe pain.  He denies any headache loss of consciousness weakness or numbness or loss of function.  He thinks he is completely normal would like to avoid CT scan if possible.   ROS:  As above  Exam:  BP (!) 142/91   Pulse 86   Temp 98.2 F (36.8 C) (Oral)   Ht 5\' 10"  (1.778 m)   Wt 228 lb 4.8 oz (103.6 kg)   BMI 32.76 kg/m  Wt Readings from Last 5 Encounters:  09/22/18 228 lb 4.8 oz (103.6 kg)  08/24/18 222 lb (100.7 kg)  08/13/18 223 lb (101.2 kg)  08/03/18 222 lb (100.7 kg)  06/29/18 224 lb (101.6 kg)   General: Well Developed, well nourished, and in no acute distress.  Neuro/Psych: Alert and oriented x3, extra-ocular muscles intact, able to move all 4 extremities, sensation grossly intact.  Normal coordination balance and gait. Skin: Warm and dry, no rashes noted.  Respiratory: Not using accessory muscles, speaking in full sentences, trachea midline.  Cardiovascular: Pulses palpable, no extremity edema. Abdomen: Does not appear distended. Scalp: No bruising swelling or tenderness. MSK: Right knee: Normal-appearing with no bruising or abrasion.  Mild effusion present. Range of motion 0-120 degrees with crepitations.  Not particularly tender to palpation.  Stable ligaments exam normal strength.    Left knee: Small abrasion  left anterior knee.  Otherwise no bruising no significant effusion.  Range of motion 120 degrees with crepitations.  Mildly tender palpation medial joint line. Stable ligamentous exam normal strength.  Antalgic gait present.     Lab and Radiology Results X-ray images knees bilaterally obtained today personally independently reviewed.  Right knee: Severe end-stage medial compartment DJD.  Moderate patellofemoral DJD.  No fractures visible.  Left knee: Moderate to severe medial compartment DJD.  Mild retropatellar DJD. No visible fractures.  Await formal radiology over read  Procedure: Real-time Ultrasound Guided Injection of right knee Device: GE Logiq E   Images permanently stored and available for review in the ultrasound unit. Verbal informed consent obtained.  Discussed risks and benefits of procedure. Warned about infection bleeding damage to structures skin hypopigmentation and fat atrophy among others. Patient expresses understanding and agreement Time-out conducted.   Noted no overlying erythema, induration, or other signs of local infection.   Skin prepped in a sterile fashion.   Local anesthesia: Topical Ethyl chloride.   With sterile technique and under real time ultrasound guidance:  40 mg of Kenalog and 3 mL of Marcaine injected easily.   Completed without difficulty   Pain immediately resolved suggesting accurate placement of the medication.   Advised to call if fevers/chills, erythema, induration, drainage, or persistent bleeding.   Images permanently stored and available for review in the ultrasound unit.  Impression: Technically successful ultrasound guided  injection.     Procedure: Real-time Ultrasound Guided Injection of left knee Device: GE Logiq E   Images permanently stored and available for review in the ultrasound unit. Verbal informed consent obtained.  Discussed risks and benefits of procedure. Warned about infection bleeding damage to structures skin  hypopigmentation and fat atrophy among others. Patient expresses understanding and agreement Time-out conducted.   Noted no overlying erythema, induration, or other signs of local infection.   Skin prepped in a sterile fashion.   Local anesthesia: Topical Ethyl chloride.   With sterile technique and under real time ultrasound guidance:  40 mg of Kenalog and 3 mL of Marcaine injected easily.   Completed without difficulty   Pain immediately resolved suggesting accurate placement of the medication.   Advised to call if fevers/chills, erythema, induration, drainage, or persistent bleeding.   Images permanently stored and available for review in the ultrasound unit.  Impression: Technically successful ultrasound guided injection.         Assessment and Plan: 68 y.o. male with  Knee pain bilaterally after fall.  Patient has significant DJD.  Fortunately does not appear to have suffered significant injury with the fall.  No visible fractures and exam is relatively benign.  Patient likely exam is exacerbation of DJD.  Plan to continue conservative management including rest bracing, topical diclofenac gel, ice.  However we will proceed with steroid injection as above.  Continue home exercise program and recheck sooner if needed.  Otherwise recheck as previously scheduled.   PDMP not reviewed this encounter. Orders Placed This Encounter  Procedures  . DG Knee Complete 4 Views Left    Please include patellar sunrise, lateral, and weightbearing bilateral AP and bilateral rosenberg views    Standing Status:   Future    Number of Occurrences:   1    Standing Expiration Date:   11/22/2019    Order Specific Question:   Reason for exam:    Answer:   Please include patellar sunrise, lateral, and weightbearing bilateral AP and bilateral rosenberg views    Comments:   Please include patellar sunrise, lateral, and weightbearing bilateral AP and bilateral rosenberg views    Order Specific Question:    Preferred imaging location?    Answer:   [facility]  . DG Knee Complete 4 Views Right    Please include patellar sunrise, lateral, and weightbearing bilateral AP and bilateral rosenberg views    Standing Status:   Future    Number of Occurrences:   1    Standing Expiration Date:   11/22/2019    Order Specific Question:   Reason for exam:    Answer:   Please include patellar sunrise, lateral, and weightbearing bilateral AP and bilateral rosenberg views    Comments:   Please include patellar sunrise, lateral, and weightbearing bilateral AP and bilateral rosenberg views    Order Specific Question:   Preferred imaging location?    Answer:   [facility]   No orders of the defined types were placed in this encounter.   Historical information moved to improve visibility of documentation.  Past Medical History:  Diagnosis Date  . Carpal tunnel syndrome    Bilateral  . Chronic atrial fibrillation 10/01/2017  . Chronic obstructive pulmonary disease ([place]) 01/13/2014   Overview:  Mild.  [facility] is following.  . Chronic systolic heart failure ([place])   . Elevated PSA 09/21/2015  . Essential hypertension 04/25/2014  . Hypertension, essential 10/01/2017  . Non-rheumatic mitral regurgitation 07/23/2017  .  Noncompliance 08/01/2017  . Nonischemic cardiomyopathy ([place]) 07/23/2017  . Nonrheumatic mitral valve regurgitation 10/01/2017  . Obesity (BMI 30-39.9) 02/15/2015  . OSA (obstructive sleep apnea) 05/10/2014  . Persistent atrial fibrillation   . Prediabetes 09/27/2015  . S/P laparoscopic appendectomy 02/15/2013  . Sleep apnea                          DECIDED AGAINST CPAP 10/01/2017  . Ulnar neuropathy    Past Surgical History:  Procedure Laterality Date  . APPENDECTOMY  02/15/2013  . ATRIAL FIBRILLATION ABLATION N/A 11/13/2017   Procedure: ATRIAL FIBRILLATION ABLATION;  Surgeon: Constance Haw, MD;  Location: [facility];  Service: Cardiovascular;  Laterality: N/A;   . sessile colonic polyp N/A 02/15/2013   Social History   Tobacco Use  . Smoking status: Never Smoker  . Smokeless tobacco: Never Used  Substance Use Topics  . Alcohol use: No   family history includes COPD in his father; Cancer in his mother; Diabetes in his father; Hypertension in his father.  Medications: Current Outpatient Medications  Medication Sig Dispense Refill  . atorvastatin (LIPITOR) 80 MG tablet TAKE 1 TABLET EVERY DAY 30 tablet 5  . diclofenac sodium (VOLTAREN) 1 % GEL Apply 2 g topically 4 (four) times daily. To affected joint. 100 g 11  . fluticasone (FLONASE) 50 MCG/ACT nasal spray Place 2 sprays into both nostrils daily. 16 g 12  . Fluticasone-Salmeterol (ADVAIR) 250-50 MCG/DOSE AEPB Inhale 1 puff into the lungs 2 (two) times daily.    . furosemide (LASIX) 40 MG tablet TAKE 1 TABLET BY MOUTH EVERY DAY 30 tablet 3  . lisinopril (ZESTRIL) 10 MG tablet Take 1 tablet (10 mg total) by mouth daily. 30 tablet 3  . loratadine (CLARITIN) 10 MG tablet Take 10 mg by mouth daily.    . metoprolol succinate (TOPROL-XL) 50 MG 24 hr tablet Take 50 mg by mouth daily. Take with or immediately following a meal.    . rivaroxaban (XARELTO) 20 MG TABS tablet Take 20 mg by mouth daily.    Marland Kitchen triamcinolone cream (KENALOG) 0.5 % Apply 1 application topically 2 (two) times daily. To affected areas. 30 g 3   No current facility-administered medications for this visit.    No Known Allergies    Discussed warning signs or symptoms. Please see discharge instructions. Patient expresses understanding.

## 2018-10-21 ENCOUNTER — Encounter: Payer: Self-pay | Admitting: Family Medicine

## 2018-10-21 ENCOUNTER — Ambulatory Visit (INDEPENDENT_AMBULATORY_CARE_PROVIDER_SITE_OTHER): Payer: Managed Care, Other (non HMO) | Admitting: Family Medicine

## 2018-10-21 VITALS — BP 129/80 | Temp 97.8°F | Wt 226.0 lb

## 2018-10-21 DIAGNOSIS — H00011 Hordeolum externum right upper eyelid: Secondary | ICD-10-CM | POA: Diagnosis not present

## 2018-10-21 MED ORDER — POLYMYXIN B-TRIMETHOPRIM 10000-0.1 UNIT/ML-% OP SOLN: 2.0000 [drp] | Freq: Four times a day (QID) | OPHTHALMIC | 0 refills | Status: DC

## 2018-10-21 NOTE — Progress Notes (Signed)
129/80 

## 2018-10-21 NOTE — Progress Notes (Signed)
Virtual Visit  via Video Note  I connected with      Christian Ortiz by a video enabled telemedicine application and verified that I am speaking with the correct person using two identifiers.   I discussed the limitations of evaluation and management by telemedicine and the availability of in person appointments. The patient expressed understanding and agreed to proceed.  History of Present Illness: Christian Ortiz is a 68 y.o. male who would like to discuss stye.  Gene developed some redness and swelling of the left upper eyelid about 3 days ago.  This improved spontaneously but starting yesterday he developed the same redness and swelling of the right upper eyelid.  He notes he is had some discharge and a little bit of pain.  He feels well otherwise with no fevers or chills.  He denies severe blurry vision.  He is tried some over-the-counter eyedrops and warm compress which have helped a little.  He has an established optometrist with my eye doctor in [place].  Observations/Objective: BP 129/80   Temp 97.8 F (36.6 C) (Oral)   Wt 226 lb (102.5 kg)   BMI 32.43 kg/m  Wt Readings from Last 5 Encounters:  10/21/18 226 lb (102.5 kg)  09/22/18 228 lb 4.8 oz (103.6 kg)  08/24/18 222 lb (100.7 kg)  08/13/18 223 lb (101.2 kg)  08/03/18 222 lb (100.7 kg)   Exam: Appearance erythematous and swollen right upper eyelid.  Left slightly swollen nonerythematous.  No conjunctival injection.  Ocular motion is intact bilaterally.  No significant swelling periorbital or inferior eyelid.  Nontoxic-appearing Normal Speech.      Assessment and Plan: 68 y.o. male with right upper eyelid swelling consistent in appearance with hordeolum.  Discussed with patient.  Plan for warm compress.  Additionally prescribed antibiotic eyedrop (Polytrim). Recheck back with me as needed.  PDMP not reviewed this encounter. No orders of the defined types were placed in this encounter.  Meds ordered this  encounter  Medications  . trimethoprim-polymyxin b (POLYTRIM) ophthalmic solution    Sig: Place 2 drops into both eyes every 6 (six) hours.    Dispense:  10 mL    Refill:  0    Follow Up Instructions:    I discussed the assessment and treatment plan with the patient. The patient was provided an opportunity to ask questions and all were answered. The patient agreed with the plan and demonstrated an understanding of the instructions.   The patient was advised to call back or seek an in-person evaluation if the symptoms worsen or if the condition fails to improve as anticipated.  Time: 15 minutes of intraservice time, with >22 minutes of total time during today's visit.      Historical information moved to improve visibility of documentation.  Past Medical History:  Diagnosis Date  . Carpal tunnel syndrome    Bilateral  . Chronic atrial fibrillation 10/01/2017  . Chronic obstructive pulmonary disease ([place]) 01/13/2014   Overview:  Mild.  [facility] is following.  . Chronic systolic heart failure ([place])   . Elevated PSA 09/21/2015  . Essential hypertension 04/25/2014  . Hypertension, essential 10/01/2017  . Non-rheumatic mitral regurgitation 07/23/2017  . Noncompliance 08/01/2017  . Nonischemic cardiomyopathy ([place]) 07/23/2017  . Nonrheumatic mitral valve regurgitation 10/01/2017  . Obesity (BMI 30-39.9) 02/15/2015  . OSA (obstructive sleep apnea) 05/10/2014  . Persistent atrial fibrillation   . Prediabetes 09/27/2015  . S/P laparoscopic appendectomy 02/15/2013  . Sleep apnea  DECIDED AGAINST CPAP 10/01/2017  . Ulnar neuropathy    Past Surgical History:  Procedure Laterality Date  . APPENDECTOMY  02/15/2013  . ATRIAL FIBRILLATION ABLATION N/A 11/13/2017   Procedure: ATRIAL FIBRILLATION ABLATION;  Surgeon: Constance Haw, MD;  Location: [facility];  Service: Cardiovascular;  Laterality: N/A;  . sessile colonic polyp N/A 02/15/2013   Social History    Tobacco Use  . Smoking status: Never Smoker  . Smokeless tobacco: Never Used  Substance Use Topics  . Alcohol use: No   family history includes COPD in his father; Cancer in his mother; Diabetes in his father; Hypertension in his father.  Medications: Current Outpatient Medications  Medication Sig Dispense Refill  . atorvastatin (LIPITOR) 80 MG tablet TAKE 1 TABLET EVERY DAY 30 tablet 5  . diclofenac sodium (VOLTAREN) 1 % GEL Apply 2 g topically 4 (four) times daily. To affected joint. 100 g 11  . fluticasone (FLONASE) 50 MCG/ACT nasal spray Place 2 sprays into both nostrils daily. 16 g 12  . Fluticasone-Salmeterol (ADVAIR) 250-50 MCG/DOSE AEPB Inhale 1 puff into the lungs 2 (two) times daily.    . furosemide (LASIX) 40 MG tablet TAKE 1 TABLET BY MOUTH EVERY DAY 30 tablet 3  . lisinopril (ZESTRIL) 10 MG tablet Take 1 tablet (10 mg total) by mouth daily. 30 tablet 3  . loratadine (CLARITIN) 10 MG tablet Take 10 mg by mouth daily.    . metoprolol succinate (TOPROL-XL) 50 MG 24 hr tablet Take 50 mg by mouth daily. Take with or immediately following a meal.    . rivaroxaban (XARELTO) 20 MG TABS tablet Take 20 mg by mouth daily.    Marland Kitchen triamcinolone cream (KENALOG) 0.5 % Apply 1 application topically 2 (two) times daily. To affected areas. 30 g 3  . trimethoprim-polymyxin b (POLYTRIM) ophthalmic solution Place 2 drops into both eyes every 6 (six) hours. 10 mL 0   No current facility-administered medications for this visit.    No Known Allergies

## 2018-10-21 NOTE — Patient Instructions (Addendum)
Thank you for coming in today. Use warm compress as much as you can to get the plugged up tear gland to drain. Apply antibiotic eyedrops frequently as directed. If worsening or not improving pretty rapidly recommend follow-up with ophthalmology.  Happy to also see you in clinic if needed.   Stye  A stye, also known as a hordeolum, is a bump that forms on an eyelid. It may look like a pimple next to the eyelash. A stye can form inside the eyelid (internal stye) or outside the eyelid (external stye). A stye can cause redness, swelling, and pain on the eyelid. Styes are very common. Anyone can get them at any age. They usually occur in just one eye, but you may have more than one in either eye. What are the causes? A stye is caused by an infection. The infection is almost always caused by bacteria called Staphylococcus aureus. This is a common type of bacteria that lives on the skin. An internal stye may result from an infected oil-producing gland inside the eyelid. An external stye may be caused by an infection at the base of the eyelash (hair follicle). What increases the risk? You are more likely to develop a stye if:  You have had a stye before.  You have any of these conditions: ? Diabetes. ? Red, itchy, inflamed eyelids (blepharitis). ? A skin condition such as seborrheic dermatitis or rosacea. ? High fat levels in your blood (lipids). What are the signs or symptoms? The most common symptom of a stye is eyelid pain. Internal styes are more painful than external styes. Other symptoms may include:  Painful swelling of your eyelid.  A scratchy feeling in your eye.  Tearing and redness of your eye.  Pus draining from the stye. How is this diagnosed? Your health care provider may be able to diagnose a stye just by examining your eye. The health care provider may also check to make sure:  You do not have a fever or other signs of a more serious infection.  The infection has not  spread to other parts of your eye or areas around your eye. How is this treated? Most styes will clear up in a few days without treatment or with warm compresses applied to the area. You may need to use antibiotic drops or ointment to treat an infection. In some cases, if your stye does not heal with routine treatment, your health care provider may drain pus from the stye using a thin blade or needle. This may be done if the stye is large, causing a lot of pain, or affecting your vision. Follow these instructions at home:  Take over-the-counter and prescription medicines only as told by your health care provider. This includes eye drops or ointments.  If you were prescribed an antibiotic medicine, apply or use it as told by your health care provider. Do not stop using the antibiotic even if your condition improves.  Apply a warm, wet cloth (warm compress) to your eye for 5-10 minutes, 4 times a day.  Clean the affected eyelid as directed by your health care provider.  Do not wear contact lenses or eye makeup until your stye has healed.  Do not try to pop or drain the stye.  Do not rub your eye. Contact a health care provider if:  You have chills or a fever.  Your stye does not go away after several days.  Your stye affects your vision.  Your eyeball becomes swollen, red, or  painful. Get help right away if:  You have pain when moving your eye around. Summary  A stye is a bump that forms on an eyelid. It may look like a pimple next to the eyelash.  A stye can form inside the eyelid (internal stye) or outside the eyelid (external stye). A stye can cause redness, swelling, and pain on the eyelid.  Your health care provider may be able to diagnose a stye just by examining your eye.  Apply a warm, wet cloth (warm compress) to your eye for 5-10 minutes, 4 times a day. This information is not intended to replace advice given to you by your health care provider. Make sure you discuss  any questions you have with your health care provider. Document Released: 11/21/2004 Document Revised: 01/24/2017 Document Reviewed: 10/24/2016 Elsevier Patient Education  2020 [employer].   Additionally I forgot to mention this with you when I had you on the phone but it is important. I will be moving to full time Sports Medicine in [place] starting on November 1st.  You will still be able to see me for your Sports Medicine or Orthopedic needs in the [facility] Location. I will still be part of [facility].   [address], [place] [address] [phone]  Telephone (phone line will be functional starting in November).  [phone] Fax [phone] Concussion Line  If you want to stay locally for your Sports Medicine issues Dr. Dianah Field here in [place] will be happy to see you.  Additionally Dr. Clearance Coots at [facility] will be happy to see you for sports medicine issues more locally.   For your primary care needs you are welcome to establish care with Dr. Emeterio Reeve.  We are working quickly to hire more physicians to cover the primary care needs however if you cannot get an appointment with Dr. Sheppard Coil in a timely manner [facility] has locations and openings for primary care services nearby.   [facility] at [facility] [address] . [employer] [place] , [place]: [phone] . Behavioral Medicine: [phone] . Fax: [facility] at [employer] [address] . [place], [place]: [phone] . Behavioral Medicine: [phone] . Fax: [phone] . Hours (M-F): 7am - Academic librarian At [facility]. [place], [place]: [phone] . Behavioral Medicine: [phone] . Fax: [phone] . Hours (M-F): 8am - Optician, dispensing at [employer] . [place], [place] Phone: [phone] . Behavioral Medicine: [phone] . Fax: [phone]

## 2018-11-05 ENCOUNTER — Encounter: Payer: Self-pay | Admitting: Family Medicine

## 2018-11-19 ENCOUNTER — Encounter: Payer: Self-pay | Admitting: Internal Medicine

## 2019-02-05 ENCOUNTER — Other Ambulatory Visit: Payer: Self-pay | Admitting: Cardiology

## 2019-02-05 MED ORDER — RIVAROXABAN 20 MG PO TABS: 20.0000 mg | ORAL_TABLET | Freq: Every day | ORAL | 5 refills | Status: DC

## 2019-02-05 NOTE — Telephone Encounter (Signed)
Prescription refill request for Xarelto received.   Last office visit: 05/15/2018, Telephone visit, [place] Weight: 102.5kg  Age: 68 y.o. Scr: 0.94, 08/03/2018 CrCl: 113.59 ml/min   Prescription refill sent.

## 2019-02-05 NOTE — Telephone Encounter (Signed)
°*  STAT* If patient is at the pharmacy, call can be transferred to refill team.   1. Which medications need to be refilled? (please list name of each medication and dose if known) rivaroxaban (XARELTO) 20 MG TABS tablet  2. Which pharmacy/location (including street and city if local pharmacy) is medication to be sent to? [id number] - [place], [place]  3. Do they need a 30 day or 90 day supply? 90 day

## 2019-02-16 ENCOUNTER — Other Ambulatory Visit: Payer: Self-pay | Admitting: Family Medicine

## 2019-02-23 ENCOUNTER — Encounter: Payer: Self-pay | Admitting: Osteopathic Medicine

## 2019-02-23 ENCOUNTER — Encounter: Payer: Managed Care, Other (non HMO) | Admitting: Family Medicine

## 2019-03-15 ENCOUNTER — Other Ambulatory Visit: Payer: Self-pay

## 2019-03-16 ENCOUNTER — Ambulatory Visit (INDEPENDENT_AMBULATORY_CARE_PROVIDER_SITE_OTHER): Payer: Managed Care, Other (non HMO) | Admitting: Medical

## 2019-03-16 ENCOUNTER — Encounter: Payer: Self-pay | Admitting: Medical

## 2019-03-16 VITALS — BP 126/70 | HR 87 | Temp 97.0°F | Resp 18 | Ht 72.0 in | Wt 247.0 lb

## 2019-03-16 DIAGNOSIS — R972 Elevated prostate specific antigen [PSA]: Secondary | ICD-10-CM | POA: Diagnosis not present

## 2019-03-16 DIAGNOSIS — Z1283 Encounter for screening for malignant neoplasm of skin: Secondary | ICD-10-CM | POA: Diagnosis not present

## 2019-03-16 DIAGNOSIS — L989 Disorder of the skin and subcutaneous tissue, unspecified: Secondary | ICD-10-CM

## 2019-03-16 DIAGNOSIS — K635 Polyp of colon: Secondary | ICD-10-CM | POA: Diagnosis not present

## 2019-03-16 DIAGNOSIS — Z0000 Encounter for general adult medical examination without abnormal findings: Secondary | ICD-10-CM | POA: Diagnosis not present

## 2019-03-16 MED ORDER — DICLOFENAC SODIUM 1 % EX GEL: 2.0000 g | Freq: Four times a day (QID) | CUTANEOUS | 2 refills | Status: AC

## 2019-03-16 NOTE — Progress Notes (Signed)
Subjective:    Patient ID: Christian Ortiz, male    DOB: 12/28/1950, 69 y.o.   MRN: [id number]  HPI  Pt in for first time. He was with Dr. Georgina Snell. Last seen in June.  Pt bp is well controlled today.  Pt weight has increased over past year. He states job also more sedentary.   Pt works for [employer]. States he just works and then goes home/sedentary. Pt is Designer, jewellery.   On review of chart/labs did see he has had elevated psa. Pt has seen urologist. He has seen urologist and he did get biopsy. Insurance issues with [employer]. Pt states couple of years since has seen urologist.   Pt states biopsy of prostate was normal per pt report.  Pt had colonosocopy in 2014.  Pt has history of high cholesterol.   It has been more than a year since his last wellness exam. Ok with doing today.  Pt also has hx of atrial fibrillation about one year ago. Pt now on xarelto. Pt has follow up appointment with his cardiologist. Pt is also on metoprolol.   Pt got flu vaccine I fall.    Review of Systems  Constitutional: Negative for chills, fatigue and fever.  HENT: Negative for congestion, ear pain, hearing loss, postnasal drip, rhinorrhea, sinus pressure, sinus pain and sneezing.   Respiratory: Negative for cough, chest tightness, shortness of breath and wheezing.   Cardiovascular: Negative for chest pain and palpitations.  Gastrointestinal: Negative for abdominal pain.  Musculoskeletal: Negative for back pain.       Knee pain.  Skin:       Mole on his back  Neurological: Negative for dizziness, seizures, weakness and headaches.  Hematological: Negative for adenopathy. Does not bruise/bleed easily.  Psychiatric/Behavioral: Negative for behavioral problems and decreased concentration.    Past Medical History:  Diagnosis Date  . Carpal tunnel syndrome    Bilateral  . Chronic atrial fibrillation ([place]) 10/01/2017  . Chronic obstructive pulmonary disease ([place]) 01/13/2014   Overview:  Mild.   [facility] is following.  . Chronic systolic heart failure ([place])   . Elevated PSA 09/21/2015  . Essential hypertension 04/25/2014  . Hypertension, essential 10/01/2017  . Non-rheumatic mitral regurgitation 07/23/2017  . Noncompliance 08/01/2017  . Nonischemic cardiomyopathy ([place]) 07/23/2017  . Nonrheumatic mitral valve regurgitation 10/01/2017  . Obesity (BMI 30-39.9) 02/15/2015  . OSA (obstructive sleep apnea) 05/10/2014  . Persistent atrial fibrillation ([place])   . Prediabetes 09/27/2015  . S/P laparoscopic appendectomy 02/15/2013  . Sleep apnea                          DECIDED AGAINST CPAP 10/01/2017  . Ulnar neuropathy      Social History   Socioeconomic History  . Marital status: Married    Spouse name: Not on file  . Number of children: Not on file  . Years of education: Not on file  . Highest education level: Not on file  Occupational History  . Not on file  Tobacco Use  . Smoking status: Never Smoker  . Smokeless tobacco: Never Used  Substance and Sexual Activity  . Alcohol use: No  . Drug use: No  . Sexual activity: Yes  Other Topics Concern  . Not on file  Social History Narrative  . Not on file   Social Determinants of Health   Financial Resource Strain:   . Difficulty of Paying Living Expenses: Not on file  Food Insecurity:   . Worried About Charity fundraiser in the Last Year: Not on file  . Ran Out of Food in the Last Year: Not on file  Transportation Needs:   . Lack of Transportation (Medical): Not on file  . Lack of Transportation (Non-Medical): Not on file  Physical Activity:   . Days of Exercise per Week: Not on file  . Minutes of Exercise per Session: Not on file  Stress:   . Feeling of Stress : Not on file  Social Connections:   . Frequency of Communication with Friends and Family: Not on file  . Frequency of Social Gatherings with Friends and Family: Not on file  . Attends Religious Services: Not on file  . Active Member of Clubs or  Organizations: Not on file  . Attends Archivist Meetings: Not on file  . Marital Status: Not on file  Intimate Partner Violence:   . Fear of Current or Ex-Partner: Not on file  . Emotionally Abused: Not on file  . Physically Abused: Not on file  . Sexually Abused: Not on file    Past Surgical History:  Procedure Laterality Date  . APPENDECTOMY  02/15/2013  . ATRIAL FIBRILLATION ABLATION N/A 11/13/2017   Procedure: ATRIAL FIBRILLATION ABLATION;  Surgeon: Constance Haw, MD;  Location: [facility];  Service: Cardiovascular;  Laterality: N/A;  . sessile colonic polyp N/A 02/15/2013    Family History  Problem Relation Age of Onset  . Cancer Mother   . Hypertension Father   . COPD Father   . Diabetes Father     No Known Allergies  Current Outpatient Medications on File Prior to Visit  Medication Sig Dispense Refill  . atorvastatin (LIPITOR) 80 MG tablet TAKE 1 TABLET EVERY DAY 30 tablet 5  . fluticasone (FLONASE) 50 MCG/ACT nasal spray Place 2 sprays into both nostrils daily. 16 g 12  . furosemide (LASIX) 40 MG tablet TAKE 1 TABLET BY MOUTH EVERY DAY 30 tablet 2  . lisinopril (ZESTRIL) 10 MG tablet Take 1 tablet (10 mg total) by mouth daily. 30 tablet 3  . loratadine (CLARITIN) 10 MG tablet Take 10 mg by mouth daily.    . metoprolol succinate (TOPROL-XL) 50 MG 24 hr tablet Take 50 mg by mouth daily. Take with or immediately following a meal.    . rivaroxaban (XARELTO) 20 MG TABS tablet Take 1 tablet (20 mg total) by mouth daily with supper. 30 tablet 5   No current facility-administered medications on file prior to visit.    BP 126/70 (BP Location: Left Arm, Patient Position: Sitting)   Pulse 87   Temp (!) 97 F (36.1 C) (Temporal)   Resp 18   Ht 6' (1.829 m)   Wt 247 lb (112 kg)   SpO2 96%   BMI 33.50 kg/m       Objective:   Physical Exam  General Mental Status- Alert. General Appearance- Not in acute distress.   Skin General: Color-  Normal Color. Moisture- Normal Moisture. Moderate sized large mole lower thoracic area. Appears greater than 6 mm. One portion of area is darker than other portion.  Neck Carotid Arteries- Normal color. Moisture- Normal Moisture. No carotid bruits. No JVD.  Chest and Lung Exam Auscultation: Breath Sounds:-Normal.  Cardiovascular Auscultation:Rythm- Regular. Murmurs & Other Heart Sounds:Auscultation of the heart reveals- No Murmurs.  Abdomen Inspection:-Inspeection Normal. Palpation/Percussion:Note:No mass. Palpation and Percussion of the abdomen reveal- Non Tender, Non Distended + BS, no rebound  or guarding.    Neurologic Cranial Nerve exam:- CN III-XII intact(No nystagmus), symmetric smile. Strength:- 5/5 equal and symmetric strength both upper and lower extremities.  Assessment & Plan:  For you wellness exam today I have ordered cbc, cmp, lipid panel and psa(due to elevated hx of psa)  Vaccine up to date. But has not gotten covid vaccine yet.  Recommend exercise and healthy diet.(try weight watcher recommended)  We will let you know lab results as they come in.  Follow up date appointment will be determined after lab review.   After psa review then refer to urologist.  Refer to gi for update colonoscopy.  Refer to derm   Continue current meds for chronic medical problems reviewed today.  Refilled voltaren gel for knee pain.

## 2019-03-16 NOTE — Patient Instructions (Addendum)
For you wellness exam today I have ordered cbc, cmp, lipid panel and psa(due to elevated hx of psa). Future labs placed and get scheduled.  Vaccine up to date. But has not gotten covid vaccine yet.  Recommend exercise and healthy diet.(try weight watcher recommended)  We will let you know lab results as they come in.  Follow up date appointment will be determined after lab review.   After psa review then refer to urologist.  Refer to gi for update colonoscopy.  Refer to derm screening skin cancer.  Continue current meds for chronic medical problems reviewed today.  Refilled voltaren gel for knee pain.   Preventive Care 31 Years and Older, Male Preventive care refers to lifestyle choices and visits with your health care provider that can promote health and wellness. This includes:  A yearly physical exam. This is also called an annual well check.  Regular dental and eye exams.  Immunizations.  Screening for certain conditions.  Healthy lifestyle choices, such as diet and exercise. What can I expect for my preventive care visit? Physical exam Your health care provider will check:  Height and weight. These may be used to calculate body mass index (BMI), which is a measurement that tells if you are at a healthy weight.  Heart rate and blood pressure.  Your skin for abnormal spots. Counseling Your health care provider may ask you questions about:  Alcohol, tobacco, and drug use.  Emotional well-being.  Home and relationship well-being.  Sexual activity.  Eating habits.  History of falls.  Memory and ability to understand (cognition).  Work and work Statistician. What immunizations do I need?  Influenza (flu) vaccine  This is recommended every year. Tetanus, diphtheria, and pertussis (Tdap) vaccine  You may need a Td booster every 10 years. Varicella (chickenpox) vaccine  You may need this vaccine if you have not already been vaccinated. Zoster  (shingles) vaccine  You may need this after age 9. Pneumococcal conjugate (PCV13) vaccine  One dose is recommended after age 16. Pneumococcal polysaccharide (PPSV23) vaccine  One dose is recommended after age 42. Measles, mumps, and rubella (MMR) vaccine  You may need at least one dose of MMR if you were born in 1957 or later. You may also need a second dose. Meningococcal conjugate (MenACWY) vaccine  You may need this if you have certain conditions. Hepatitis A vaccine  You may need this if you have certain conditions or if you travel or work in places where you may be exposed to hepatitis A. Hepatitis B vaccine  You may need this if you have certain conditions or if you travel or work in places where you may be exposed to hepatitis B. Haemophilus influenzae type b (Hib) vaccine  You may need this if you have certain conditions. You may receive vaccines as individual doses or as more than one vaccine together in one shot (combination vaccines). Talk with your health care provider about the risks and benefits of combination vaccines. What tests do I need? Blood tests  Lipid and cholesterol levels. These may be checked every 5 years, or more frequently depending on your overall health.  Hepatitis C test.  Hepatitis B test. Screening  Lung cancer screening. You may have this screening every year starting at age 19 if you have a 30-pack-year history of smoking and currently smoke or have quit within the past 15 years.  Colorectal cancer screening. All adults should have this screening starting at age 78 and continuing until age 24.  Your health care provider may recommend screening at age 85 if you are at increased risk. You will have tests every 1-10 years, depending on your results and the type of screening test.  Prostate cancer screening. Recommendations will vary depending on your family history and other risks.  Diabetes screening. This is done by checking your blood sugar  (glucose) after you have not eaten for a while (fasting). You may have this done every 1-3 years.  Abdominal aortic aneurysm (AAA) screening. You may need this if you are a current or former smoker.  Sexually transmitted disease (STD) testing. Follow these instructions at home: Eating and drinking  Eat a diet that includes fresh fruits and vegetables, whole grains, lean protein, and low-fat dairy products. Limit your intake of foods with high amounts of sugar, saturated fats, and salt.  Take vitamin and mineral supplements as recommended by your health care provider.  Do not drink alcohol if your health care provider tells you not to drink.  If you drink alcohol: ? Limit how much you have to 0-2 drinks a day. ? Be aware of how much alcohol is in your drink. In the [place] one drink equals one 12 oz bottle of beer (355 mL), one 5 oz glass of wine (148 mL), or one 1 oz glass of hard liquor (44 mL). Lifestyle  Take daily care of your teeth and gums.  Stay active. Exercise for at least 30 minutes on 5 or more days each week.  Do not use any products that contain nicotine or tobacco, such as cigarettes, e-cigarettes, and chewing tobacco. If you need help quitting, ask your health care provider.  If you are sexually active, practice safe sex. Use a condom or other form of protection to prevent STIs (sexually transmitted infections).  Talk with your health care provider about taking a low-dose aspirin or statin. What's next?  Visit your health care provider once a year for a well check visit.  Ask your health care provider how often you should have your eyes and teeth checked.  Stay up to date on all vaccines. This information is not intended to replace advice given to you by your health care provider. Make sure you discuss any questions you have with your health care provider. Document Revised: 02/05/2018 Document Reviewed: 02/05/2018 Elsevier Patient Education  2020 [employer].

## 2019-03-19 ENCOUNTER — Other Ambulatory Visit (INDEPENDENT_AMBULATORY_CARE_PROVIDER_SITE_OTHER): Payer: Managed Care, Other (non HMO)

## 2019-03-19 ENCOUNTER — Other Ambulatory Visit: Payer: Self-pay

## 2019-03-19 DIAGNOSIS — R972 Elevated prostate specific antigen [PSA]: Secondary | ICD-10-CM

## 2019-03-19 DIAGNOSIS — Z0000 Encounter for general adult medical examination without abnormal findings: Secondary | ICD-10-CM | POA: Diagnosis not present

## 2019-03-19 LAB — COMPREHENSIVE METABOLIC PANEL
ALT: 10 U/L (ref 0–53)
AST: 15 U/L (ref 0–37)
Albumin: 4.2 g/dL (ref 3.5–5.2)
Alkaline Phosphatase: 80 U/L (ref 39–117)
BUN: 19 mg/dL (ref 6–23)
CO2: 24 mEq/L (ref 19–32)
Calcium: 9.2 mg/dL (ref 8.4–10.5)
Chloride: 107 mEq/L (ref 96–112)
Creatinine, Ser: 0.78 mg/dL (ref 0.40–1.50)
GFR: 99.55 mL/min (ref >60.00)
Glucose, Bld: 84 mg/dL (ref 70–99)
Potassium: 4.3 mEq/L (ref 3.5–5.1)
Sodium: 140 mEq/L (ref 135–145)
Total Bilirubin: 0.8 mg/dL (ref 0.2–1.2)
Total Protein: 6.6 g/dL (ref 6.0–8.3)

## 2019-03-19 LAB — LIPID PANEL
Cholesterol: 130 mg/dL (ref 0–200)
HDL: 53.5 mg/dL (ref >39.00)
LDL Cholesterol: 66 mg/dL (ref 0–99)
NonHDL: 76.14
Total CHOL/HDL Ratio: 2
Triglycerides: 53 mg/dL (ref 0.0–149.0)
VLDL: 10.6 mg/dL (ref 0.0–40.0)

## 2019-03-19 LAB — CBC WITH DIFFERENTIAL/PLATELET
Basophils Absolute: 0.1 10*3/uL (ref 0.0–0.1)
Basophils Relative: 0.8 % (ref 0.0–3.0)
Eosinophils Absolute: 0.4 10*3/uL (ref 0.0–0.7)
Eosinophils Relative: 5.4 % — ABNORMAL HIGH (ref 0.0–5.0)
HCT: 42.9 % (ref 39.0–52.0)
Hemoglobin: 14.2 g/dL (ref 13.0–17.0)
Lymphocytes Relative: 28.3 % (ref 12.0–46.0)
Lymphs Abs: 2 10*3/uL (ref 0.7–4.0)
MCHC: 33.2 g/dL (ref 30.0–36.0)
MCV: 95.4 fl (ref 78.0–100.0)
Monocytes Absolute: 0.6 10*3/uL (ref 0.1–1.0)
Monocytes Relative: 9 % (ref 3.0–12.0)
Neutro Abs: 3.9 10*3/uL (ref 1.4–7.7)
Neutrophils Relative %: 56.5 % (ref 43.0–77.0)
Platelets: 245 10*3/uL (ref 150.0–400.0)
RBC: 4.5 Mil/uL (ref 4.22–5.81)
RDW: 13.4 % (ref 11.5–15.5)
WBC: 7 10*3/uL (ref 4.0–10.5)

## 2019-03-19 LAB — PSA: PSA: 6.51 ng/mL — ABNORMAL HIGH (ref 0.10–4.00)

## 2019-03-20 ENCOUNTER — Telehealth: Payer: Self-pay | Admitting: Medical

## 2019-03-20 DIAGNOSIS — R972 Elevated prostate specific antigen [PSA]: Secondary | ICD-10-CM

## 2019-03-20 NOTE — Telephone Encounter (Signed)
Referral to urologist placed. 

## 2019-03-30 ENCOUNTER — Encounter: Payer: Self-pay | Admitting: Cardiology

## 2019-03-30 ENCOUNTER — Ambulatory Visit (INDEPENDENT_AMBULATORY_CARE_PROVIDER_SITE_OTHER): Payer: Managed Care, Other (non HMO) | Admitting: Cardiology

## 2019-03-30 ENCOUNTER — Other Ambulatory Visit: Payer: Self-pay

## 2019-03-30 VITALS — BP 126/72 | HR 67 | Ht 72.0 in | Wt 247.0 lb

## 2019-03-30 DIAGNOSIS — I4819 Other persistent atrial fibrillation: Secondary | ICD-10-CM | POA: Diagnosis not present

## 2019-03-30 NOTE — Progress Notes (Signed)
Electrophysiology Office Note   Date:  03/30/2019   ID:  Christian Ortiz, Christian Ortiz April 30, 1950, MRN [id number]  PCP:  Mackie Pai, PA-C  Cardiologist:  Cathlean Sauer MD [facility]) Primary Electrophysiologist:  Dr Curt Bears    CC: Follow up for atrial fibrillation s/p afib ablation.   History of Present Illness: Christian Ortiz is a 69 y.o. male who is being seen today for the evaluation of atrial fibrillation at the request of [facility]. Presenting today for electrophysiology evaluation.  He has a history of atrial fibrillation, COPD, hypertension, nonischemic cardiomyopathy, obesity, obstructive sleep apnea.  He presented to the emergency room 07/22/2017 with atrial fibrillation and rapid rates.  He was admitted to the hospital with an echo showing a mild cardiomyopathy with an EF of 40 to 45%.  He is found to have 3+ mitral insufficiency though his mitral valve looks normal on echo.  He was started on amiodarone and underwent a TEE guided cardioversion which was unsuccessful after 3 shocks.  On return to clinic, he was in sinus rhythm.  He is now status post AF ablation 11/13/2017.  Today, denies symptoms of palpitations, chest pain, shortness of breath, orthopnea, PND, lower extremity edema, claudication, dizziness, presyncope, syncope, bleeding, or neurologic sequela. The patient is tolerating medications without difficulties.  Overall he is doing well.  He has no chest pain or shortness of breath.  He is able to do all of his daily activities.  Unfortunately he has gained up to 20 pounds over the last few months.  He said that he would like to lose weight.  Past Medical History:  Diagnosis Date  . Carpal tunnel syndrome    Bilateral  . Chronic atrial fibrillation ([place]) 10/01/2017  . Chronic obstructive pulmonary disease ([place]) 01/13/2014   Overview:  Mild.  [facility] is following.  . Chronic systolic heart failure ([place])   . Elevated PSA 09/21/2015  . Essential hypertension 04/25/2014  .  Hypertension, essential 10/01/2017  . Non-rheumatic mitral regurgitation 07/23/2017  . Noncompliance 08/01/2017  . Nonischemic cardiomyopathy ([place]) 07/23/2017  . Nonrheumatic mitral valve regurgitation 10/01/2017  . Obesity (BMI 30-39.9) 02/15/2015  . OSA (obstructive sleep apnea) 05/10/2014  . Persistent atrial fibrillation ([place])   . Prediabetes 09/27/2015  . S/P laparoscopic appendectomy 02/15/2013  . Sleep apnea                          DECIDED AGAINST CPAP 10/01/2017  . Ulnar neuropathy    Past Surgical History:  Procedure Laterality Date  . APPENDECTOMY  02/15/2013  . ATRIAL FIBRILLATION ABLATION N/A 11/13/2017   Procedure: ATRIAL FIBRILLATION ABLATION;  Surgeon: Constance Haw, MD;  Location: [facility];  Service: Cardiovascular;  Laterality: N/A;  . sessile colonic polyp N/A 02/15/2013     Current Outpatient Medications  Medication Sig Dispense Refill  . atorvastatin (LIPITOR) 80 MG tablet TAKE 1 TABLET EVERY DAY 30 tablet 5  . diclofenac Sodium (VOLTAREN) 1 % GEL Apply 2 g topically 4 (four) times daily. 350 g 2  . fluticasone (FLONASE) 50 MCG/ACT nasal spray Place 2 sprays into both nostrils daily. 16 g 12  . furosemide (LASIX) 40 MG tablet TAKE 1 TABLET BY MOUTH EVERY DAY 30 tablet 2  . lisinopril (ZESTRIL) 10 MG tablet Take 1 tablet (10 mg total) by mouth daily. 30 tablet 3  . metoprolol succinate (TOPROL-XL) 50 MG 24 hr tablet Take 50 mg by mouth daily. Take with or  immediately following a meal.    . rivaroxaban (XARELTO) 20 MG TABS tablet Take 1 tablet (20 mg total) by mouth daily with supper. 30 tablet 5   No current facility-administered medications for this visit.    Allergies:   Patient has no known allergies.   Social History:  The patient  reports that he has never smoked. He has never used smokeless tobacco. He reports that he does not drink alcohol or use drugs.   Family History:  The patient's family history includes COPD in his father; Cancer in his  mother; Diabetes in his father; Hypertension in his father.    ROS:  Please see the history of present illness.   Otherwise, review of systems is positive for none.   All other systems are reviewed and negative.   PHYSICAL EXAM: VS:  BP 126/72   Pulse 67   Ht 6' (1.829 m)   Wt 247 lb (112 kg)   SpO2 97%   BMI 33.50 kg/m  , BMI Body mass index is 33.5 kg/m. GEN: Well nourished, well developed, in no acute distress  HEENT: normal  Neck: no JVD, carotid bruits, or masses Cardiac: RRR; no murmurs, rubs, or gallops,no edema  Respiratory:  clear to auscultation bilaterally, normal work of breathing GI: soft, nontender, nondistended, + BS MS: no deformity or atrophy  Skin: warm and dry Neuro:  Strength and sensation are intact Psych: euthymic mood, full affect  EKG:  EKG is ordered today. Personal review of the ekg ordered shows this rhythm, rate 67, PVC  Recent Labs: 03/19/2019: ALT 10; BUN 19; Creatinine, Ser 0.78; Hemoglobin 14.2; Platelets 245.0; Potassium 4.3; Sodium 140    Lipid Panel     Component Value Date/Time   CHOL 130 03/19/2019 1011   TRIG 53.0 03/19/2019 1011   HDL 53.50 03/19/2019 1011   CHOLHDL 2 03/19/2019 1011   VLDL 10.6 03/19/2019 1011   LDLCALC 66 03/19/2019 1011   LDLCALC 69 12/22/2017 1554   LDLDIRECT 70 08/03/2018 1621     Wt Readings from Last 3 Encounters:  03/30/19 247 lb (112 kg)  03/16/19 247 lb (112 kg)  10/21/18 226 lb (102.5 kg)      Other studies Reviewed: Additional studies/ records that were reviewed today include: TTE 07/23/17  Review of the above records today demonstrates:  The left ventricle is mildly dilated. There is mild concentric left ventricular hypertrophy. There is mild diffuse hypokinesis of the left ventricle. The left ventricular ejection fraction is moderately reduced (40-45%). Grade I mild diastolic dysfunction; abnormal relaxation pattern. The left atrium is moderately dilated. There is mild (1+) tricuspid  regurgitation. There is mild aortic valve thickening with trace regurgitation.   ASSESSMENT AND PLAN:  1.  Persistent atrial fibrillation: Status post ablation 11/13/2017.  CHA2DS2-VASc of 3.  Currently on Xarelto.  Fortunately remained in sinus rhythm off of his amiodarone.  2.  Hypertension: Currently well controlled  3.  Nonischemic cardiomyopathy: Currently on optimal medical therapy.  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  4.  Obesity: Patient has changed his diet.  I have also told him about 150 minutes of moderate level exercise a week.  His goal weight at his next appointment is 200 pounds.  Current medicines are reviewed at length with the patient today.   The patient does not have concerns regarding his medicines.  The following changes were made today: None  Labs/ tests ordered today include:  Orders Placed This Encounter  Procedures  . EKG 12-Lead  Disposition:   FU with Landyn Buckalew 12 months  Signed, Abir Eroh Meredith Leeds, MD  03/30/2019 10:06 AM     [facility] [address] [place] [address] [phone] (office) [phone] (fax)

## 2019-04-21 ENCOUNTER — Telehealth: Payer: Self-pay | Admitting: *Deleted

## 2019-04-21 NOTE — Telephone Encounter (Signed)
   [facility] Pre-operative Risk Assessment    Request for surgical clearance:  1. What type of surgery is being performed?  PROSTATE BIOPSY   2. When is this surgery scheduled?  TBD   3. What type of clearance is required (medical clearance vs. Pharmacy clearance to hold med vs. Both)?  PHARMACY  4. Are there any medications that need to be held prior to surgery and how long? XARELTO 3 DAYS   5. Practice name and name of physician performing surgery?  [facility]    6. What is your office phone number [phone]    7.   What is your office fax number [phone]  8.   Anesthesia type (None, local, MAC, general) ?  [facility] FOR SURGERY SCHEDULER TO CALL BACK    Jeanann Lewandowsky 04/21/2019, 9:57 AM  _________________________________________________________________   (provider comments below)

## 2019-04-21 NOTE — Telephone Encounter (Signed)
Follow up   Ander Purpura is calling to inform of anesthesia type   Anesthesia Type: Local

## 2019-04-21 NOTE — Telephone Encounter (Signed)
Pharm please address [email or web address] thanks 

## 2019-04-21 NOTE — Telephone Encounter (Signed)
Pt takes Xarelto for afib with CHADS2VASc score of 3 (age, CHF, HTN). Renal function is normal. Ok to hold Xarelto for 2-3 days prior to biopsy.

## 2019-05-05 ENCOUNTER — Emergency Department (HOSPITAL_BASED_OUTPATIENT_CLINIC_OR_DEPARTMENT_OTHER)
Admission: EM | Admit: 2019-05-05 | Discharge: 2019-05-05 | Disposition: A | Discharge status: Home / Self Care | Payer: No Typology Code available for payment source | Attending: Emergency Medicine | Admitting: Emergency Medicine

## 2019-05-05 ENCOUNTER — Encounter (HOSPITAL_BASED_OUTPATIENT_CLINIC_OR_DEPARTMENT_OTHER): Payer: Self-pay | Admitting: *Deleted

## 2019-05-05 ENCOUNTER — Emergency Department (HOSPITAL_BASED_OUTPATIENT_CLINIC_OR_DEPARTMENT_OTHER): Payer: No Typology Code available for payment source

## 2019-05-05 ENCOUNTER — Other Ambulatory Visit: Payer: Self-pay

## 2019-05-05 DIAGNOSIS — Z7901 Long term (current) use of anticoagulants: Secondary | ICD-10-CM | POA: Diagnosis not present

## 2019-05-05 DIAGNOSIS — Y9389 Activity, other specified: Secondary | ICD-10-CM | POA: Diagnosis not present

## 2019-05-05 DIAGNOSIS — I4891 Unspecified atrial fibrillation: Secondary | ICD-10-CM | POA: Diagnosis not present

## 2019-05-05 DIAGNOSIS — M25561 Pain in right knee: Secondary | ICD-10-CM | POA: Diagnosis not present

## 2019-05-05 DIAGNOSIS — Y990 Civilian activity done for income or pay: Secondary | ICD-10-CM | POA: Diagnosis not present

## 2019-05-05 DIAGNOSIS — Y929 Unspecified place or not applicable: Secondary | ICD-10-CM | POA: Diagnosis not present

## 2019-05-05 NOTE — ED Triage Notes (Signed)
Reports that he fell in the back of a package truck today. Right knee swelling and pain since that time.

## 2019-05-05 NOTE — Discharge Instructions (Addendum)
Please follow up with Dr. Lucia Gaskins orthopedist for further evaluation Use the knee immobilizer and crutches to ambulate. Do not bare weight onto your knee.  While at home please rest, ice, and elevate your knee to reduce swelling Take Tylenol as needed for pain Return to the ED for any worsening symptoms including worsening pain, worsening swelling, inability to bend the knee, redness to the joint, fevers > 100.4, chills

## 2019-05-05 NOTE — ED Provider Notes (Signed)
[name] EMERGENCY DEPARTMENT Provider Note   CSN: [id number] Arrival date & time: 05/05/19  1953     History Chief Complaint  Patient presents with  . Knee Pain    Christian Ortiz is a 69 y.o. male who presents to the ED today complaining of sudden onset, constant, achy/throbbing, R knee pain s/p mechanical fall that occurred this morning around 8 AM. Pt reports he was stepping up into a truck today when he missed the step and fell down, landing directly onto the knee. He states he had immediate pain to the knee and had immediate swelling. Pt has been applying ice and taking Tylenol for the pain. He is anticoagulated on Xarelto. Denies fevers, chills, or any other associated symptoms.   The history is provided by the patient.       Past Medical History:  Diagnosis Date  . Carpal tunnel syndrome    Bilateral  . Chronic atrial fibrillation ([place]) 10/01/2017  . Chronic obstructive pulmonary disease ([place]) 01/13/2014   Overview:  Mild.  [facility] is following.  . Chronic systolic heart failure ([place])   . Elevated PSA 09/21/2015  . Essential hypertension 04/25/2014  . Hypertension, essential 10/01/2017  . Non-rheumatic mitral regurgitation 07/23/2017  . Noncompliance 08/01/2017  . Nonischemic cardiomyopathy ([place]) 07/23/2017  . Nonrheumatic mitral valve regurgitation 10/01/2017  . Obesity (BMI 30-39.9) 02/15/2015  . OSA (obstructive sleep apnea) 05/10/2014  . Persistent atrial fibrillation ([place])   . Prediabetes 09/27/2015  . S/P laparoscopic appendectomy 02/15/2013  . Sleep apnea                          DECIDED AGAINST CPAP 10/01/2017  . Ulnar neuropathy     Patient Active Problem List   Diagnosis Date Noted  . PAF (paroxysmal atrial fibrillation) ([place]) 03/24/2018  . Chronic atrial fibrillation ([place]) 10/01/2017  . Noncompliance 08/01/2017  . Nonrheumatic mitral valve regurgitation 07/23/2017  . Nonischemic cardiomyopathy ([place]) 07/23/2017  . Chronic systolic heart  failure ([place]) 07/22/2017  . Prediabetes 09/27/2015  . Right knee pain 09/21/2015  . Carpal tunnel syndrome 09/21/2015  . Ulnar neuropathy 09/21/2015  . Elevated PSA 04/05/2015  . Obesity (BMI 30.0-34.9) 02/15/2015  . OSA (obstructive sleep apnea) 05/10/2014  . Hypertension, essential 04/25/2014  . Chronic obstructive pulmonary disease ([place]) 01/13/2014  . Gastritis 11/18/2013  . Lower urinary tract symptoms (LUTS) 10/29/2013  . S/P laparoscopic appendectomy 02/15/2013  . Sessile colonic polyp 10/30/2012  . Sleep apnea                          DECIDED AGAINST CPAP 10/30/2012    Past Surgical History:  Procedure Laterality Date  . APPENDECTOMY  02/15/2013  . ATRIAL FIBRILLATION ABLATION N/A 11/13/2017   Procedure: ATRIAL FIBRILLATION ABLATION;  Surgeon: Constance Haw, MD;  Location: [facility];  Service: Cardiovascular;  Laterality: N/A;  . sessile colonic polyp N/A 02/15/2013       Family History  Problem Relation Age of Onset  . Cancer Mother   . Hypertension Father   . COPD Father   . Diabetes Father     Social History   Tobacco Use  . Smoking status: Never Smoker  . Smokeless tobacco: Never Used  Substance Use Topics  . Alcohol use: No  . Drug use: No    Home Medications Prior to Admission medications   Medication Sig Start Date End Date Taking?  Authorizing Provider  atorvastatin (LIPITOR) 80 MG tablet TAKE 1 TABLET EVERY DAY 08/04/18   Baldwin Jamaica, PA-C  diclofenac Sodium (VOLTAREN) 1 % GEL Apply 2 g topically 4 (four) times daily. 03/16/19   Saguier, Percell Miller, PA-C  fluticasone (FLONASE) 50 MCG/ACT nasal spray Place 2 sprays into both nostrils daily. 03/24/18   Gregor Hams, MD  furosemide (LASIX) 40 MG tablet TAKE 1 TABLET BY MOUTH EVERY DAY 02/16/19   Silverio Decamp, MD  lisinopril (ZESTRIL) 10 MG tablet Take 1 tablet (10 mg total) by mouth daily. 07/13/18   Gregor Hams, MD  metoprolol succinate (TOPROL-XL) 50 MG 24 hr tablet Take 50  mg by mouth daily. Take with or immediately following a meal.    [provider]  rivaroxaban (XARELTO) 20 MG TABS tablet Take 1 tablet (20 mg total) by mouth daily with supper. 02/05/19   Camnitz, Ocie Doyne, MD    Allergies    Patient has no known allergies.  Review of Systems   Review of Systems  Constitutional: Negative for chills and fever.  Musculoskeletal: Positive for arthralgias and joint swelling.    Physical Exam Updated Vital Signs BP 127/81 (BP Location: Right Arm)   Pulse 62   Temp 98.2 F (36.8 C) (Oral)   Resp 18   Ht 6' (1.829 m)   Wt 108.9 kg   SpO2 96%   BMI 32.55 kg/m   Physical Exam Vitals and nursing note reviewed.  Constitutional:      Appearance: He is not ill-appearing or diaphoretic.  HENT:     Head: Normocephalic and atraumatic.  Eyes:     Conjunctiva/sclera: Conjunctivae normal.  Cardiovascular:     Rate and Rhythm: Normal rate and regular rhythm.     Pulses: Normal pulses.  Pulmonary:     Effort: Pulmonary effort is normal.     Breath sounds: Normal breath sounds. No wheezing, rhonchi or rales.  Musculoskeletal:     Comments: Moderate swelling of R knee compared to L. + Tenderness to palpation to suprapatellar aspect. ROM intact. Negative anterior and posterior drawer test. Some instability with valgus maneuver. No varus laxity. Strength and sensation intact. 2+ PT pulse.   Skin:    General: Skin is warm and dry.     Coloration: Skin is not jaundiced.  Neurological:     Mental Status: He is alert.     ED Results / Procedures / Treatments   Labs (all labs ordered are listed, but only abnormal results are displayed) Labs Reviewed - No data to display  EKG None  Radiology DG Knee Complete 4 Views Right  Result Date: 05/05/2019 CLINICAL DATA:  Recent fall with lateral knee pain, initial encounter EXAM: RIGHT KNEE - COMPLETE 4+ VIEW COMPARISON:  None. FINDINGS: Tricompartmental degenerative changes are noted most marked in  the medial joint space. No joint effusion is seen. No acute fracture or dislocation is noted. IMPRESSION: Degenerative change without acute abnormality. Electronically Signed   By: Inez Catalina M.D.   On: 05/05/2019 20:24    Procedures Procedures (including critical care time)  Medications Ordered in ED Medications - No data to display  ED Course  I have reviewed the triage vital signs and the nursing notes.  Pertinent labs & imaging results that were available during my care of the patient were reviewed by me and considered in my medical decision making (see chart for details).  70 year old male who presents to the ED today complaining of sudden onset  right knee pain status post mechanical fall that occurred today.  He is anticoagulated on Xarelto.  No head injury or loss of consciousness.  Has had immediate swelling and pain to the knee.  On arrival to the ED patient is afebrile, not tachycardic and not tachypneic.  He appears to be in no acute distress.  He has moderate swelling of his right knee compared to left with tenderness to palpation.  There does seem to be some instability with valgus maneuver.  Will obtain x-ray at this time.   X-ray negative.  Knee immobilizer and crutches ordered for patient. Pt will follow up with orthopedist for further evaluation. RICE therapy discussed. Tylenol PRN for pain given he is anticoagulated. Strict return precautions discussed. Pt is in agreement with plan and stable for discharge home.   This note was prepared using Dragon voice recognition software and may include unintentional dictation errors due to the inherent limitations of voice recognition software.    MDM Rules/Calculators/A&P                       Final Clinical Impression(s) / ED Diagnoses Final diagnoses:  Acute pain of right knee    Rx / DC Orders ED Discharge Orders    None       Discharge Instructions     Please follow up with Dr. Lucia Gaskins orthopedist for further  evaluation Use the knee immobilizer and crutches to ambulate. Do not bare weight onto your knee.  While at home please rest, ice, and elevate your knee to reduce swelling Take Tylenol as needed for pain Return to the ED for any worsening symptoms including worsening pain, worsening swelling, inability to bend the knee, redness to the joint, fevers > 100.4, chills       Eustaquio Maize, PA-C 05/05/19 2051    Veryl Speak, MD 05/05/19 2135

## 2019-05-19 ENCOUNTER — Encounter: Payer: Self-pay | Admitting: Medical

## 2019-05-19 MED ORDER — ATORVASTATIN CALCIUM 80 MG PO TABS: 80.0000 mg | ORAL_TABLET | Freq: Every day | ORAL | 5 refills | Status: AC

## 2019-06-01 ENCOUNTER — Other Ambulatory Visit: Payer: Self-pay | Admitting: Family Medicine

## 2019-06-03 MED ORDER — METOPROLOL SUCCINATE ER 50 MG PO TB24: 50.0000 mg | ORAL_TABLET | Freq: Every day | ORAL | 1 refills | Status: AC

## 2019-10-12 ENCOUNTER — Other Ambulatory Visit: Payer: Self-pay | Admitting: Cardiology

## 2019-10-12 NOTE — Telephone Encounter (Signed)
Xarelto 20mg  refill request received. Pt is 69 years old, weight-108.9kg, Crea-0.78 on 03/19/2019, last seen by Dr. Curt Bears on 03/30/2019, Diagnosis-Afib, CrCl-143.59ml/min; Dose is appropriate based on dosing criteria. Will send in refill to requested pharmacy.

## 2019-10-12 NOTE — Telephone Encounter (Signed)
Pt last saw Dr Curt Bears 03/30/19, last labs 03/19/19 Creat 0.78, age 69, weight 108.9kg, CrCl 143.49, based on CrCl pt is on appropriate dosage of Xarelto 20mg  QD.  Will refill rx.

## 2019-11-05 ENCOUNTER — Emergency Department (INDEPENDENT_AMBULATORY_CARE_PROVIDER_SITE_OTHER): Payer: Managed Care, Other (non HMO)

## 2019-11-05 ENCOUNTER — Emergency Department (INDEPENDENT_AMBULATORY_CARE_PROVIDER_SITE_OTHER)
Admission: EM | Admit: 2019-11-05 | Discharge: 2019-11-05 | Disposition: A | Discharge status: Home / Self Care | Payer: Managed Care, Other (non HMO) | Source: Home / Self Care

## 2019-11-05 ENCOUNTER — Encounter: Payer: Self-pay | Admitting: Emergency Medicine

## 2019-11-05 ENCOUNTER — Other Ambulatory Visit: Payer: Self-pay

## 2019-11-05 DIAGNOSIS — R1010 Upper abdominal pain, unspecified: Secondary | ICD-10-CM | POA: Diagnosis not present

## 2019-11-05 DIAGNOSIS — R1013 Epigastric pain: Secondary | ICD-10-CM

## 2019-11-05 DIAGNOSIS — R1310 Dysphagia, unspecified: Secondary | ICD-10-CM

## 2019-11-05 LAB — POCT CBC W AUTO DIFF (K'VILLE URGENT CARE)

## 2019-11-05 MED ORDER — OMEPRAZOLE 20 MG PO CPDR: 20.0000 mg | DELAYED_RELEASE_CAPSULE | Freq: Every day | ORAL | 0 refills | Status: DC

## 2019-11-05 MED ORDER — LIDOCAINE VISCOUS HCL 2 % MT SOLN
15.0000 mL | Freq: Once | OROMUCOSAL | Status: AC
  Administered 2019-11-05: 15 mL via ORAL

## 2019-11-05 MED ORDER — ALUM & MAG HYDROXIDE-SIMETH 200-200-20 MG/5ML PO SUSP
30.0000 mL | Freq: Once | ORAL | Status: AC
  Administered 2019-11-05: 30 mL via ORAL

## 2019-11-05 NOTE — Discharge Instructions (Signed)
  Please start the medication that was prescribed today. Call to schedule a follow up appointment with primary care or with a gastroenterologist for further evaluation and treatment of symptoms.     Call 911 or have someone drive you to the hospital if symptoms significantly worsening.

## 2019-11-05 NOTE — ED Triage Notes (Signed)
Ate some chicken &  Small amount of steak on Monday & Tuesday night - woke with epigastric pain on Wed  No relief w/ pepto bismol yesterday Difficulty swallowing the last 2 days  Here today after swallowing his morning pills w/ increase in epigastric burning Pt will need a work note

## 2019-11-05 NOTE — ED Provider Notes (Signed)
[facility]    CSN: [id number] Arrival date & time: 11/05/19  0948      History   Chief Complaint Chief Complaint  Patient presents with  . Sore Throat  . Gastroesophageal Reflux    HPI Christian Ortiz is a 69 y.o. male.   HPI Christian Ortiz is a 69 y.o. male with hx of chronic afib presenting to UC with c/o epigastric pain that started after eating small amount of chicken and steak the night before.  He has since has trouble swallowing the last 2 days and even had increased epigastric pain swallowing his morning pills.  Pain is a burning sensation.  He has not tried anything at home to help with his pain with eating.  Denies gagging or vomiting after swallowing, just pain. Pt also requesting a note for work as he needed to call out to be seen today.  Denies fever, chills, n/v/d. Denies trouble breathing.     Past Medical History:  Diagnosis Date  . Carpal tunnel syndrome    Bilateral  . Chronic atrial fibrillation ([place]) 10/01/2017  . Chronic obstructive pulmonary disease ([place]) 01/13/2014   Overview:  Mild.  [facility] is following.  . Chronic systolic heart failure ([place])   . Elevated PSA 09/21/2015  . Essential hypertension 04/25/2014  . Hypertension, essential 10/01/2017  . Non-rheumatic mitral regurgitation 07/23/2017  . Noncompliance 08/01/2017  . Nonischemic cardiomyopathy ([place]) 07/23/2017  . Nonrheumatic mitral valve regurgitation 10/01/2017  . Obesity (BMI 30-39.9) 02/15/2015  . OSA (obstructive sleep apnea) 05/10/2014  . Persistent atrial fibrillation ([place])   . Prediabetes 09/27/2015  . S/P laparoscopic appendectomy 02/15/2013  . Sleep apnea                          DECIDED AGAINST CPAP 10/01/2017  . Ulnar neuropathy     Patient Active Problem List   Diagnosis Date Noted  . PAF (paroxysmal atrial fibrillation) ([place]) 03/24/2018  . Chronic atrial fibrillation ([place]) 10/01/2017  . Noncompliance 08/01/2017  . Nonrheumatic mitral valve regurgitation  07/23/2017  . Nonischemic cardiomyopathy ([place]) 07/23/2017  . Chronic systolic heart failure ([place]) 07/22/2017  . Prediabetes 09/27/2015  . Right knee pain 09/21/2015  . Carpal tunnel syndrome 09/21/2015  . Ulnar neuropathy 09/21/2015  . Elevated PSA 04/05/2015  . Obesity (BMI 30.0-34.9) 02/15/2015  . OSA (obstructive sleep apnea) 05/10/2014  . Hypertension, essential 04/25/2014  . Chronic obstructive pulmonary disease ([place]) 01/13/2014  . Gastritis 11/18/2013  . Lower urinary tract symptoms (LUTS) 10/29/2013  . S/P laparoscopic appendectomy 02/15/2013  . Sessile colonic polyp 10/30/2012  . Sleep apnea                          DECIDED AGAINST CPAP 10/30/2012    Past Surgical History:  Procedure Laterality Date  . APPENDECTOMY  02/15/2013  . ATRIAL FIBRILLATION ABLATION N/A 11/13/2017   Procedure: ATRIAL FIBRILLATION ABLATION;  Surgeon: Constance Haw, MD;  Location: [facility];  Service: Cardiovascular;  Laterality: N/A;  . sessile colonic polyp N/A 02/15/2013       Home Medications    Prior to Admission medications   Medication Sig Start Date End Date Taking? Authorizing Provider  atorvastatin (LIPITOR) 80 MG tablet Take 1 tablet (80 mg total) by mouth daily. 05/19/19  Yes Saguier, Percell Miller, PA-C  furosemide (LASIX) 40 MG tablet TAKE 1 TABLET BY MOUTH EVERY DAY 02/16/19  Yes Thekkekandam,  Gwen Her, MD  lisinopril (ZESTRIL) 10 MG tablet Take 1 tablet (10 mg total) by mouth daily. 07/13/18  Yes Gregor Hams, MD  metoprolol succinate (TOPROL-XL) 50 MG 24 hr tablet Take 1 tablet (50 mg total) by mouth daily. Take with or immediately following a meal. 06/03/19  Yes Camnitz, Will Hassell Done, MD  XARELTO 20 MG TABS tablet TAKE 1 TABLET BY MOUTH EVERY DAY WITH SUPPER 10/12/19  Yes Camnitz, Will Hassell Done, MD  diclofenac Sodium (VOLTAREN) 1 % GEL Apply 2 g topically 4 (four) times daily. 03/16/19   Saguier, Percell Miller, PA-C  fluticasone (FLONASE) 50 MCG/ACT nasal spray Place 2 sprays into  both nostrils daily. 03/24/18   Gregor Hams, MD  omeprazole (PRILOSEC) 20 MG capsule Take 1 capsule (20 mg total) by mouth daily for 14 days. 11/05/19 11/19/19  Noe Gens, PA-C    Family History Family History  Problem Relation Age of Onset  . Cancer Mother   . Hypertension Father   . COPD Father   . Diabetes Father     Social History Social History   Tobacco Use  . Smoking status: Never Smoker  . Smokeless tobacco: Never Used  Vaping Use  . Vaping Use: Never used  Substance Use Topics  . Alcohol use: No  . Drug use: No     Allergies   Patient has no known allergies.   Review of Systems Review of Systems  Constitutional: Negative for chills and fever.  HENT: Positive for sore throat and trouble swallowing. Negative for congestion, ear pain and voice change.   Respiratory: Negative for cough and shortness of breath.   Cardiovascular: Negative for chest pain and palpitations.  Gastrointestinal: Positive for abdominal pain (epigastric  ). Negative for diarrhea, nausea and vomiting.  Musculoskeletal: Negative for arthralgias, back pain and myalgias.  Skin: Negative for rash.  Neurological: Negative for dizziness and headaches.  All other systems reviewed and are negative.    Physical Exam Triage Vital Signs ED Triage Vitals  Enc Vitals Group     BP 11/05/19 1018 139/89     Pulse Rate 11/05/19 1018 66     Resp 11/05/19 1018 16     Temp 11/05/19 1018 98.7 F (37.1 C)     Temp Source 11/05/19 1018 Oral     SpO2 11/05/19 1018 96 %     Weight 11/05/19 1020 234 lb (106.1 kg)     Height 11/05/19 1020 6' (1.829 m)     Head Circumference --      Peak Flow --      Pain Score --      Pain Loc --      Pain Edu? --      Excl. in [place]? --    No data found.  Updated Vital Signs BP 139/89 (BP Location: Right Arm)   Pulse 66   Temp 98.7 F (37.1 C) (Oral)   Resp 16   Ht 6' (1.829 m)   Wt 234 lb (106.1 kg)   SpO2 96%   BMI 31.74 kg/m   Visual Acuity Right  Eye Distance:   Left Eye Distance:   Bilateral Distance:    Right Eye Near:   Left Eye Near:    Bilateral Near:     Physical Exam Vitals and nursing note reviewed.  Constitutional:      General: He is not in acute distress.    Appearance: He is well-developed. He is not ill-appearing, toxic-appearing or diaphoretic.  HENT:  Head: Normocephalic and atraumatic.     Right Ear: Tympanic membrane and ear canal normal.     Left Ear: Tympanic membrane and ear canal normal.     Nose: Nose normal.     Right Sinus: No maxillary sinus tenderness or frontal sinus tenderness.     Left Sinus: No maxillary sinus tenderness or frontal sinus tenderness.     Mouth/Throat:     Lips: Pink.     Mouth: Mucous membranes are moist.     Pharynx: Oropharynx is clear. Uvula midline. Posterior oropharyngeal erythema (mild erythema of uvula) present. No pharyngeal swelling, oropharyngeal exudate or uvula swelling.     Tonsils: No tonsillar exudate or tonsillar abscesses.  Cardiovascular:     Rate and Rhythm: Normal rate. Rhythm irregular.  Pulmonary:     Effort: Pulmonary effort is normal. No respiratory distress.     Breath sounds: Normal breath sounds. No stridor. No wheezing, rhonchi or rales.  Abdominal:     General: There is no distension.     Palpations: Abdomen is soft. There is no mass.     Tenderness: There is abdominal tenderness ( mild upper) in the right upper quadrant, epigastric area and left upper quadrant. There is no right CVA tenderness, left CVA tenderness, guarding or rebound. Negative signs include Murphy's sign and McBurney's sign.     Hernia: No hernia is present.  Musculoskeletal:        General: Normal range of motion.     Cervical back: Normal range of motion and neck supple.  Lymphadenopathy:     Cervical: No cervical adenopathy.  Skin:    General: Skin is warm and dry.  Neurological:     Mental Status: He is alert and oriented to person, place, and time.  Psychiatric:         Behavior: Behavior normal.      UC Treatments / Results  Labs (all labs ordered are listed, but only abnormal results are displayed) Labs Reviewed  COMPLETE METABOLIC PANEL WITH GFR  LIPASE  POCT CBC W AUTO DIFF ([place])    EKG   Radiology DG Neck Soft Tissue  Result Date: 11/05/2019 CLINICAL DATA:  Dysphagia. EXAM: NECK SOFT TISSUES - 1+ VIEW COMPARISON:  No prior. FINDINGS: Retropharyngeal soft tissues are normal. Epiglottis appears normal. Cervical airway widely patent. Diffuse degenerative change cervical spine. No acute bony abnormality. Pulmonary apices are clear. IMPRESSION: No acute or focal abnormality identified Electronically Signed   By: [place]   On: 11/05/2019 11:17   DG Chest 2 View  Result Date: 11/05/2019 CLINICAL DATA:  Dysphagia. EXAM: CHEST - 2 VIEW COMPARISON:  Chest x-ray 03/28/2016. FINDINGS: Mediastinum hilar structures normal. Cardiomegaly. No pulmonary venous congestion. Stable mild bilateral interstitial prominence, most likely chronic. No pleural effusion or pneumothorax. Degenerative change thoracic spine. IMPRESSION: 1.  Cardiomegaly.  No pulmonary venous congestion. 2. Stable mild bilateral interstitial prominence, most likely chronic. Electronically Signed   By: Marcello Moores  Register   On: 11/05/2019 11:20    Procedures Procedures (including critical care time)  Medications Ordered in UC Medications  alum & mag hydroxide-simeth (MAALOX/MYLANTA) 200-200-20 MG/5ML suspension 30 mL (30 mLs Oral Given 11/05/19 1041)    And  lidocaine (XYLOCAINE) 2 % viscous mouth solution 15 mL (15 mLs Oral Given 11/05/19 1041)    Initial Impression / Assessment and Plan / UC Course  I have reviewed the triage vital signs and the nursing notes.  Pertinent labs & imaging results that were  available during my care of the patient were reviewed by me and considered in my medical decision making (see chart for details).     Hx and exam c/w GERD vs  gastritis vs ulcer.  CMP and Lipase sent to lab to r/o cholecystitis vs pancreatitis (no prior hx) Encouraged f/u with PCP Discussed symptoms that warrant emergent care in the ED. AVS given along with requested work note.  Final Clinical Impressions(s) / UC Diagnoses   Final diagnoses:  Dysphagia, unspecified type  Upper abdominal pain  Abdominal pain, epigastric     Discharge Instructions      Please start the medication that was prescribed today. Call to schedule a follow up appointment with primary care or with a gastroenterologist for further evaluation and treatment of symptoms.     Call 911 or have someone drive you to the hospital if symptoms significantly worsening.     ED Prescriptions    Medication Sig Dispense Auth. Provider   omeprazole (PRILOSEC) 20 MG capsule Take 1 capsule (20 mg total) by mouth daily for 14 days. 14 capsule Noe Gens, [place]     PDMP not reviewed this encounter.   Noe Gens, [place] 11/06/19 2316

## 2019-11-06 LAB — COMPLETE METABOLIC PANEL WITH GFR
AG Ratio: 1.5 (calc) (ref 1.0–2.5)
ALT: 12 U/L (ref 9–46)
AST: 14 U/L (ref 10–35)
Albumin: 4.1 g/dL (ref 3.6–5.1)
Alkaline phosphatase (APISO): 95 U/L (ref 35–144)
BUN: 12 mg/dL (ref 7–25)
CO2: 29 mmol/L (ref 20–32)
Calcium: 9.2 mg/dL (ref 8.6–10.3)
Chloride: 105 mmol/L (ref 98–110)
Creat: 0.86 mg/dL (ref 0.70–1.25)
GFR, Est African American: 105 mL/min/{1.73_m2} (ref >=60)
GFR, Est Non African American: 90 mL/min/{1.73_m2} (ref >=60)
Globulin: 2.7 g/dL (calc) (ref 1.9–3.7)
Glucose, Bld: 86 mg/dL (ref 65–99)
Potassium: 4.1 mmol/L (ref 3.5–5.3)
Sodium: 140 mmol/L (ref 135–146)
Total Bilirubin: 0.8 mg/dL (ref 0.2–1.2)
Total Protein: 6.8 g/dL (ref 6.1–8.1)

## 2019-11-06 LAB — LIPASE: Lipase: 17 U/L (ref 7–60)

## 2019-11-24 MED ORDER — RIVAROXABAN 20 MG PO TABS: ORAL_TABLET | ORAL | 1 refills | Status: DC

## 2019-12-28 NOTE — Progress Notes (Signed)
Cardiology Office Note Date:  12/28/2019  Patient ID:  Ortiz, Christian 19-Feb-1951, MRN [id number] PCP:  Christian Pai, PA-C  Cardiologist/Electrophysiologist: Dr. Curt Bears    Chief Complaint:  Fluctuating HR, SOB  History of Present Illness: Christian Ortiz is a 69 y.o. male with history of COPD, HTN,  (suspect tachy-mediated), AFib obesity, OSA >> he denies this.  Mentions at one time did have CPAP sent to him but was intolerant/didnt work, sent it back.  He denies symptoms of sleep apnea.    He comes in today to be seen for Christian Ortiz, last seen by him feb 2019.  At that time, noted he had gained 20lbs in the prior few months, had no c/o, maintaining SR off amiodarone post ablation. Encouraged weight loss and exercise.  TODAY He is doing well.  Mentions that in the last couple weeks at work particularly he is gettig winded with activities that had not previously.  Others have mentioned to him as well, that  He looks SOB with heavier activities. 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 He also mentions that he has been on light duty since March after a work accident that fractured his leg/knee. 24mo ago got back on the job but not quite back to full duty until recently.  He denies any kind of CP He does not think he has had any afib and seldom feels palpitations or a sense that he may go into AF. No dizzy spells, near syncope or syncope.  He was at the utrologist recently and they mentioned his heart beat irregular and their machine had a hard tome getting his BP.  With this and his DOE his wife urged he come in to get seen.   AFib hx Diagnosed 2019 Amiodarone 2019 PVI ablation 11/13/2017 Off amiodarone   Past Medical History:  Diagnosis Date  . Carpal tunnel syndrome    Bilateral  . Chronic atrial fibrillation ([place]) 10/01/2017  . Chronic obstructive pulmonary disease ([place]) 01/13/2014   Overview:  Mild.  [facility] is  following.  . Chronic systolic heart failure ([place])   . Elevated PSA 09/21/2015  . Essential hypertension 04/25/2014  . Hypertension, essential 10/01/2017  . Non-rheumatic mitral regurgitation 07/23/2017  . Noncompliance 08/01/2017  . Nonischemic cardiomyopathy ([place]) 07/23/2017  . Nonrheumatic mitral valve regurgitation 10/01/2017  . Obesity (BMI 30-39.9) 02/15/2015  . OSA (obstructive sleep apnea) 05/10/2014  . Persistent atrial fibrillation ([place])   . Prediabetes 09/27/2015  . S/P laparoscopic appendectomy 02/15/2013  . Sleep apnea                          DECIDED AGAINST CPAP 10/01/2017  . Ulnar neuropathy     Past Surgical History:  Procedure Laterality Date  . APPENDECTOMY  02/15/2013  . ATRIAL FIBRILLATION ABLATION N/A 11/13/2017   Procedure: ATRIAL FIBRILLATION ABLATION;  Surgeon: Constance Haw, MD;  Location: [facility];  Service: Cardiovascular;  Laterality: N/A;  . sessile colonic polyp N/A 02/15/2013    Current Outpatient Medications  Medication Sig Dispense Refill  . atorvastatin (LIPITOR) 80 MG tablet Take 1 tablet (80 mg total) by mouth daily. 30 tablet 5  . diclofenac Sodium (VOLTAREN) 1 % GEL Apply 2 g topically 4 (four) times daily. 350 g 2  . fluticasone (FLONASE) 50 MCG/ACT nasal spray Place 2 sprays into both nostrils daily. 16 g 12  . furosemide (LASIX) 40 MG tablet TAKE 1  TABLET BY MOUTH EVERY DAY 30 tablet 2  . lisinopril (ZESTRIL) 10 MG tablet Take 1 tablet (10 mg total) by mouth daily. 30 tablet 3  . metoprolol succinate (TOPROL-XL) 50 MG 24 hr tablet Take 1 tablet (50 mg total) by mouth daily. Take with or immediately following a meal. 90 tablet 1  . omeprazole (PRILOSEC) 20 MG capsule Take 1 capsule (20 mg total) by mouth daily for 14 days. 14 capsule 0  . rivaroxaban (XARELTO) 20 MG TABS tablet TAKE 1 TABLET BY MOUTH EVERY DAY WITH SUPPER 90 tablet 1   No current facility-administered medications for this visit.    Allergies:   Patient has no  known allergies.   Social History:  The patient  reports that he has never smoked. He has never used smokeless tobacco. He reports that he does not drink alcohol and does not use drugs.   Family History:  The patient's family history includes COPD in his father; Cancer in his mother; Diabetes in his father; Hypertension in his father.  ROS:  Please see the history of present illness.    All other systems are reviewed and otherwise negative.   PHYSICAL EXAM:  VS:  There were no vitals taken for this visit. BMI: There is no height or weight on file to calculate BMI. Well nourished, well developed, in no acute distress HEENT: normocephalic, atraumatic Neck: no JVD, carotid bruits or masses Cardiac:   RRR; a few extrasystoles, no significant murmurs, no rubs, or gallops Lungs:  CTA b/l, no wheezing, rhonchi or rales Abd: soft, nontender MS: no deformity or atrophy Ext:  no edema Skin: warm and dry, no rash Neuro:  No gross deficits appreciated Psych: euthymic mood, full affect     EKG:  Done today and reviewed by myself shows  SR 79bpm, PAC  11/13/2017: EPS/Ablation CONCLUSIONS: 1. Sinus rhythm upon presentation.   2. Successful electrical isolation and anatomical encircling of all four pulmonary veins with radiofrequency current. 3. No inducible arrhythmias following ablation both on and off of Isuprel 4. No early apparent complications.    TTE 07/23/17   The left ventricle is mildly dilated. There is mild concentric left ventricular hypertrophy. There is mild diffuse hypokinesis of the left ventricle. The left ventricular ejection fraction is moderately reduced (40-45%). Grade I mild diastolic dysfunction; abnormal relaxation pattern. The left atrium is moderately dilated. There is mild (1+) tricuspid regurgitation. There is mild aortic valve thickening with trace regurgitation.    Recent Labs: 03/19/2019: Hemoglobin 14.2; Platelets 245.0 11/05/2019: ALT 12; BUN 12;  Creat 0.86; Potassium 4.1; Sodium 140  03/19/2019: Cholesterol 130; HDL 53.50; LDL Cholesterol 66; Total CHOL/HDL Ratio 2; Triglycerides 53.0; VLDL 10.6   CrCl cannot be calculated (Patient's most recent lab result is older than the maximum 21 days allowed.).   Wt Readings from Last 3 Encounters:  11/05/19 234 lb (106.1 kg)  05/05/19 240 lb (108.9 kg)  03/30/19 247 lb (112 kg)     Other studies reviewed: Additional studies/records reviewed today include: summarized above  ASSESSMENT AND PLAN:  1. Persistent AFib     CHA2DS2Vasc is 3, on Xarelto, appropriately dosed     Off amiodarone post ablation 2019      He has not felt like he has had any AFib  2. NICM     Winded of late  I suspect perhaps more to do with deconditioning 2/2 being on light duty/leg injury, his body habitus as well. He has hx of mild reduction  in LVEF and will update his echo Exam does not suggest volume OL CBC today  3. HTN     follow  4. Obesity     He denies sleep apnea     Has lost weight from last year he notes.     Encouraged exercise and weight loss       Disposition: F/u with [place] in 41mo, sooner if needed  Current medicines are reviewed at length with the patient today.  The patient did not have any concerns regarding medicines.  Venetia Night, PA-C 12/28/2019 7:47 PM     [address] [place] [address] [phone] (office)  [phone] (fax)

## 2019-12-29 ENCOUNTER — Other Ambulatory Visit: Payer: Self-pay

## 2019-12-29 ENCOUNTER — Ambulatory Visit (INDEPENDENT_AMBULATORY_CARE_PROVIDER_SITE_OTHER): Payer: Managed Care, Other (non HMO) | Admitting: Physician Assistant

## 2019-12-29 ENCOUNTER — Telehealth: Payer: Self-pay | Admitting: Medical

## 2019-12-29 ENCOUNTER — Encounter: Payer: Self-pay | Admitting: Physician Assistant

## 2019-12-29 VITALS — BP 120/90 | HR 79 | Ht 72.0 in | Wt 243.4 lb

## 2019-12-29 DIAGNOSIS — R0602 Shortness of breath: Secondary | ICD-10-CM | POA: Diagnosis not present

## 2019-12-29 DIAGNOSIS — I428 Other cardiomyopathies: Secondary | ICD-10-CM

## 2019-12-29 DIAGNOSIS — I10 Essential (primary) hypertension: Secondary | ICD-10-CM

## 2019-12-29 DIAGNOSIS — I4819 Other persistent atrial fibrillation: Secondary | ICD-10-CM | POA: Diagnosis not present

## 2019-12-29 LAB — CBC
Hematocrit: 45.3 % (ref 37.5–51.0)
Hemoglobin: 15.4 g/dL (ref 13.0–17.7)
MCH: 31.4 pg (ref 26.6–33.0)
MCHC: 34 g/dL (ref 31.5–35.7)
MCV: 92 fL (ref 79–97)
Platelets: 294 10*3/uL (ref 150–450)
RBC: 4.91 x10E6/uL (ref 4.14–5.80)
RDW: 13.2 % (ref 11.6–15.4)
WBC: 7.3 10*3/uL (ref 3.4–10.8)

## 2019-12-29 NOTE — Telephone Encounter (Signed)
Called patient told him you wanted to see him for an appointment in the morning for labs and he stated he had labs done last month and I stated yes in sept in the hospital and he stated he never went to the hospital and I said " stated his demographics and told him his visit was for abd. pain in sept" and he said that's not me but he never went to the hospital and I said okay did you still want to come in next weds & he said " no because I did not go to the hospital , but we will call you back to scheduled an appointment."  Will call patient back tomorrow to try to schedule an appointment

## 2019-12-29 NOTE — Telephone Encounter (Signed)
Have not seen pt in some time. Please get him scheduled for follow up early morning advise to come in fasting so can get labs.

## 2019-12-29 NOTE — Patient Instructions (Signed)
Medication Instructions:   Your physician recommends that you continue on your current medications as directed. Please refer to the Current Medication list given to you today.  *If you need a refill on your cardiac medications before your next appointment, please call your pharmacy*   Lab Work:  CBC TODAY   If you have labs (blood work) drawn today and your tests are completely normal, you will receive your results only by: Marland Kitchen MyChart Message (if you have MyChart) OR . A paper copy in the mail If you have any lab test that is abnormal or we need to change your treatment, we will call you to review the results.   Testing/Procedures: Your physician has requested that you have an echocardiogram. Echocardiography is a painless test that uses sound waves to create images of your heart. It provides your doctor with information about the size and shape of your heart and how well your heart's chambers and valves are working. This procedure takes approximately one hour. There are no restrictions for this procedure.   Follow-Up: At [facility], you and your health needs are our priority.  As part of our continuing mission to provide you with exceptional heart care, we have created designated Provider Care Teams.  These Care Teams include your primary Cardiologist (physician) and Advanced Practice Providers (APPs -  Physician Assistants and Nurse Practitioners) who all work together to provide you with the care you need, when you need it.  We recommend signing up for the patient portal called "MyChart".  Sign up information is provided on this After Visit Summary.  MyChart is used to connect with patients for Virtual Visits (Telemedicine).  Patients are able to view lab/test results, encounter notes, upcoming appointments, etc.  Non-urgent messages can be sent to your provider as well.   To learn more about what you can do with MyChart, go to [email or web address].    Your next appointment:   2-3  month(s)  The format for your next appointment:   In Person  Provider:   You may see Dr. Curt Bears  or one of the following Advanced Practice Providers on your designated Care Team:    Tommye Standard, [place]   Other Instructions

## 2019-12-30 ENCOUNTER — Telehealth: Payer: Self-pay | Admitting: Medical

## 2019-12-30 NOTE — Telephone Encounter (Signed)
Pt talked with Jarrett Soho. See that note. I have not seen him in 10 months. Wanted him to follow up.  Also saw that he saw internal medicine at [facility] like he does not want to follow up.  Does he consider me his pcp.  If so want him to come in for follow up.  If I am not his pcp need to know.

## 2020-01-18 NOTE — Telephone Encounter (Signed)
Called pt and was unable to lvm due to it not being set up

## 2020-01-25 ENCOUNTER — Other Ambulatory Visit: Payer: Self-pay

## 2020-01-25 ENCOUNTER — Ambulatory Visit (HOSPITAL_COMMUNITY): Payer: Managed Care, Other (non HMO) | Attending: Cardiology

## 2020-01-25 DIAGNOSIS — R0602 Shortness of breath: Secondary | ICD-10-CM | POA: Insufficient documentation

## 2020-01-25 LAB — ECHOCARDIOGRAM COMPLETE: Area-P 1/2: 2.49 cm2

## 2020-01-25 NOTE — Telephone Encounter (Signed)
Called patient's number unable to lvm and called wife's number and lvm to return call to schedule an appt

## 2020-02-11 ENCOUNTER — Other Ambulatory Visit: Payer: Self-pay | Admitting: Urology

## 2020-02-11 DIAGNOSIS — C61 Malignant neoplasm of prostate: Secondary | ICD-10-CM

## 2020-03-08 ENCOUNTER — Ambulatory Visit
Admission: RE | Admit: 2020-03-08 | Discharge: 2020-03-08 | Disposition: A | Discharge status: Home / Self Care | Payer: 59 | Source: Ambulatory Visit | Attending: Urology | Admitting: Urology

## 2020-03-08 DIAGNOSIS — C61 Malignant neoplasm of prostate: Secondary | ICD-10-CM

## 2020-03-08 MED ORDER — GADOBENATE DIMEGLUMINE 529 MG/ML IV SOLN
20.0000 mL | Freq: Once | INTRAVENOUS | Status: AC | PRN
  Administered 2020-03-08: 20 mL via INTRAVENOUS

## 2020-04-12 NOTE — Progress Notes (Unsigned)
Cardiology Office Note Date:  04/17/2020  Patient ID:  Christian Ortiz, Christian Ortiz 01/01/1951, MRN [id number] PCP:  Mackie Pai, PA-C  Cardiologist/Electrophysiologist: Dr. Curt Bears    Chief Complaint:  planned f/u  History of Present Illness: Christian Ortiz is a 70 y.o. male with history of COPD, HTN,  (suspect tachy-mediated), AFib obesity, OSA >> he denies this.  Mentions at one time did have CPAP sent to him but was intolerant/didnt work, sent it back.  He denies symptoms of sleep apnea.    He comes in today to be seen for Christian Ortiz, last seen by him feb 2019.  At that time, noted he had gained 20lbs in the prior few months, had no c/o, maintaining SR off amiodarone post ablation. Encouraged weight loss and exercise.  I saw him Nov 2021 He is doing well.  Mentions that in the last couple weeks at work particularly he is gettig winded with activities that had not previously.  Others have mentioned to him as well, that  He looks SOB with heavier activities. 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 He also mentions that he has been on light duty since March after a work accident that fractured his leg/knee. 25mo ago got back on the job but not quite back to full duty until recently. He denies any kind of CP He does not think he has had any afib and seldom feels palpitations or a sense that he may go into AF. No dizzy spells, near syncope or syncope. He was at the urologist recently and they mentioned his heart beat irregular and their machine had a hard tome getting his BP. With this and his DOE his wife urged he come in to get seen. Suspected deconditioning from his baseline after his leg injury, planned to update his echo, check labs.  Exam did not suggest volume OL, EKG looked OK  TTE noted LVEF 55-60%, no WMA, grade I DD, no VHD of any significance.  He was recommended to continue to exercise to his capacity and if not improved, let us know and  would plan stress testing. CBC with stable H/H  TODAY He is doing OK, unfortunately since our last visit was dx with prostate cancer.  He has had treatment with Lupron, states that if this showed reduction in size enough he would be a candidate for seed implants. He is working and feels like he is at his bseline exertional capacity No DOE that he has appreciated anymore He and his son plan to start exercising together soon. No CP, no palpitations or cardiac awareness. No dizzy spells, near syncope or syncope. No bleeding or signs of bleeding  AFib hx Diagnosed 2019 Amiodarone 2019 PVI ablation 11/13/2017 Off amiodarone post ablation   Past Medical History:  Diagnosis Date  . Carpal tunnel syndrome    Bilateral  . Chronic atrial fibrillation ([place]) 10/01/2017  . Chronic obstructive pulmonary disease ([place]) 01/13/2014   Overview:  Mild.  [facility] is following.  . Chronic systolic heart failure ([place])   . Elevated PSA 09/21/2015  . Essential hypertension 04/25/2014  . Hypertension, essential 10/01/2017  . Non-rheumatic mitral regurgitation 07/23/2017  . Noncompliance 08/01/2017  . Nonischemic cardiomyopathy ([place]) 07/23/2017  . Nonrheumatic mitral valve regurgitation 10/01/2017  . Obesity (BMI 30-39.9) 02/15/2015  . OSA (obstructive sleep apnea) 05/10/2014  . Persistent atrial fibrillation ([place])   . Prediabetes 09/27/2015  . S/P laparoscopic appendectomy 02/15/2013  . Sleep apnea  DECIDED AGAINST CPAP 10/01/2017  . Ulnar neuropathy     Past Surgical History:  Procedure Laterality Date  . APPENDECTOMY  02/15/2013  . ATRIAL FIBRILLATION ABLATION N/A 11/13/2017   Procedure: ATRIAL FIBRILLATION ABLATION;  Surgeon: Constance Haw, MD;  Location: [facility];  Service: Cardiovascular;  Laterality: N/A;  . sessile colonic polyp N/A 02/15/2013    Current Outpatient Medications  Medication Sig Dispense Refill  . atorvastatin (LIPITOR) 80 MG tablet Take  1 tablet (80 mg total) by mouth daily. 30 tablet 5  . diclofenac Sodium (VOLTAREN) 1 % GEL Apply 2 g topically 4 (four) times daily. 350 g 2  . fluticasone (FLONASE) 50 MCG/ACT nasal spray Place 2 sprays into both nostrils daily. 16 g 12  . furosemide (LASIX) 40 MG tablet TAKE 1 TABLET BY MOUTH EVERY DAY 30 tablet 2  . lisinopril (ZESTRIL) 10 MG tablet Take 1 tablet (10 mg total) by mouth daily. 30 tablet 3  . metoprolol succinate (TOPROL-XL) 50 MG 24 hr tablet Take 1 tablet (50 mg total) by mouth daily. Take with or immediately following a meal. 90 tablet 1  . omeprazole (PRILOSEC) 20 MG capsule Take 20 mg by mouth daily.    . rivaroxaban (XARELTO) 20 MG TABS tablet TAKE 1 TABLET BY MOUTH EVERY DAY WITH SUPPER 90 tablet 1   No current facility-administered medications for this visit.    Allergies:   Patient has no known allergies.   Social History:  The patient  reports that he has never smoked. He has never used smokeless tobacco. He reports that he does not drink alcohol and does not use drugs.   Family History:  The patient's family history includes COPD in his father; Cancer in his mother; Diabetes in his father; Hypertension in his father.  ROS:  Please see the history of present illness.    All other systems are reviewed and otherwise negative.   PHYSICAL EXAM:  VS:  BP 120/76   Pulse 69   Ht 6' (1.829 m)   Wt 245 lb (111.1 kg)   SpO2 95%   BMI 33.23 kg/m  BMI: Body mass index is 33.23 kg/m. Well nourished, well developed, in no acute distress HEENT: normocephalic, atraumatic Neck: no JVD, carotid bruits or masses Cardiac:   RRR; a few extrasystoles, no significant murmurs, no rubs, or gallops Lungs:   CTA b/l, no wheezing, rhonchi or rales Abd: soft, nontender, obese MS: no deformity or atrophy Ext:  no edema Skin: warm and dry, no rash Neuro:  No gross deficits appreciated Psych: euthymic mood, full affect     EKG:  Done today and reviewed by myself shows  Not  done today   01/25/2020: TTE IMPRESSIONS  1. Left ventricular ejection fraction, by estimation, is 55 to 60%. The  left ventricle has normal function. The left ventricle has no regional  wall motion abnormalities. Left ventricular diastolic parameters are  consistent with Grade I diastolic  dysfunction (impaired relaxation).  2. Right ventricular systolic function is normal. The right ventricular  size is normal.  3. The mitral valve is normal in structure. Mild mitral valve  regurgitation. No evidence of mitral stenosis.  4. The aortic valve is normal in structure. Aortic valve regurgitation is  trivial. Mild aortic valve sclerosis is present, with no evidence of  aortic valve stenosis.  5. The inferior vena cava is normal in size with greater than 50%  respiratory variability, suggesting right atrial pressure of 3 mmHg.  11/13/2017: EPS/Ablation CONCLUSIONS: 1. Sinus rhythm upon presentation.   2. Successful electrical isolation and anatomical encircling of all four pulmonary veins with radiofrequency current. 3. No inducible arrhythmias following ablation both on and off of Isuprel 4. No early apparent complications.    TTE 07/23/17   The left ventricle is mildly dilated. There is mild concentric left ventricular hypertrophy. There is mild diffuse hypokinesis of the left ventricle. The left ventricular ejection fraction is moderately reduced (40-45%). Grade I mild diastolic dysfunction; abnormal relaxation pattern. The left atrium is moderately dilated. There is mild (1+) tricuspid regurgitation. There is mild aortic valve thickening with trace regurgitation.    Recent Labs: 11/05/2019: ALT 12; BUN 12; Creat 0.86; Potassium 4.1; Sodium 140 12/29/2019: Hemoglobin 15.4; Platelets 294  No results found for requested labs within last 8760 hours.   CrCl cannot be calculated (Patient's most recent lab result is older than the maximum 21 days allowed.).   Wt Readings  from Last 3 Encounters:  04/17/20 245 lb (111.1 kg)  12/29/19 243 lb 6.4 oz (110.4 kg)  11/05/19 234 lb (106.1 kg)     Other studies reviewed: Additional studies/records reviewed today include: summarized above  ASSESSMENT AND PLAN:  1. Persistent AFib     CHA2DS2Vasc is 3, on Xarelto, appropriately dosed     Off amiodarone post ablation 2019      He has not felt like he has had any AFib Labs today  2. NICM     Recovered LV     No symptoms or exam findings to suggest volume OL     Had grade I DD on his echo   3. HTN     Looks good  4. Obesity     He denies sleep apnea     Encouraged to lose weight   Disposition: will see him back in 64mo, sooner if needed.  Current medicines are reviewed at length with the patient today.  The patient did not have any concerns regarding medicines.  Venetia Night, PA-C 04/17/2020 11:45 AM     [address] [place] [address] [phone] (office)  [phone] (fax)

## 2020-04-17 ENCOUNTER — Encounter: Payer: Self-pay | Admitting: Physician Assistant

## 2020-04-17 ENCOUNTER — Ambulatory Visit (INDEPENDENT_AMBULATORY_CARE_PROVIDER_SITE_OTHER): Payer: 59 | Admitting: Physician Assistant

## 2020-04-17 ENCOUNTER — Other Ambulatory Visit: Payer: Self-pay

## 2020-04-17 VITALS — BP 120/76 | HR 69 | Ht 72.0 in | Wt 245.0 lb

## 2020-04-17 DIAGNOSIS — I10 Essential (primary) hypertension: Secondary | ICD-10-CM

## 2020-04-17 DIAGNOSIS — Z79899 Other long term (current) drug therapy: Secondary | ICD-10-CM | POA: Diagnosis not present

## 2020-04-17 DIAGNOSIS — I428 Other cardiomyopathies: Secondary | ICD-10-CM | POA: Diagnosis not present

## 2020-04-17 DIAGNOSIS — I4819 Other persistent atrial fibrillation: Secondary | ICD-10-CM | POA: Diagnosis not present

## 2020-04-17 LAB — BASIC METABOLIC PANEL
BUN/Creatinine Ratio: 16 (ref 10–24)
BUN: 15 mg/dL (ref 8–27)
CO2: 22 mmol/L (ref 20–29)
Calcium: 9.4 mg/dL (ref 8.6–10.2)
Chloride: 103 mmol/L (ref 96–106)
Creatinine, Ser: 0.92 mg/dL (ref 0.76–1.27)
GFR calc Af Amer: 99 mL/min/{1.73_m2} (ref >59)
GFR calc non Af Amer: 86 mL/min/{1.73_m2} (ref >59)
Glucose: 95 mg/dL (ref 65–99)
Potassium: 4.7 mmol/L (ref 3.5–5.2)
Sodium: 141 mmol/L (ref 134–144)

## 2020-04-17 LAB — CBC
Hematocrit: 44 % (ref 37.5–51.0)
Hemoglobin: 14.8 g/dL (ref 13.0–17.7)
MCH: 30.5 pg (ref 26.6–33.0)
MCHC: 33.6 g/dL (ref 31.5–35.7)
MCV: 91 fL (ref 79–97)
Platelets: 273 10*3/uL (ref 150–450)
RBC: 4.85 x10E6/uL (ref 4.14–5.80)
RDW: 12.1 % (ref 11.6–15.4)
WBC: 7.3 10*3/uL (ref 3.4–10.8)

## 2020-04-17 MED ORDER — RIVAROXABAN 20 MG PO TABS: ORAL_TABLET | ORAL | 1 refills | Status: DC

## 2020-04-17 NOTE — Patient Instructions (Signed)
Medication Instructions:   Your physician recommends that you continue on your current medications as directed. Please refer to the Current Medication list given to you today.  *If you need a refill on your cardiac medications before your next appointment, please call your pharmacy*   Lab Work: BMET AND CBC TODAY   If you have labs (blood work) drawn today and your tests are completely normal, you will receive your results only by: Marland Kitchen MyChart Message (if you have MyChart) OR . A paper copy in the mail If you have any lab test that is abnormal or we need to change your treatment, we will call you to review the results.   Testing/Procedures: NONE ORDERED  TODAY   Follow-Up: At [facility], you and your health needs are our priority.  As part of our continuing mission to provide you with exceptional heart care, we have created designated Provider Care Teams.  These Care Teams include your primary Cardiologist (physician) and Advanced Practice Providers (APPs -  Physician Assistants and Nurse Practitioners) who all work together to provide you with the care you need, when you need it.  We recommend signing up for the patient portal called "MyChart".  Sign up information is provided on this After Visit Summary.  MyChart is used to connect with patients for Virtual Visits (Telemedicine).  Patients are able to view lab/test results, encounter notes, upcoming appointments, etc.  Non-urgent messages can be sent to your provider as well.   To learn more about what you can do with MyChart, go to [email or web address].    Your next appointment:   6 month(s)  The format for your next appointment:   In Person  Provider:   You may see [name], MD or one of the following Advanced Practice Providers on your designated Care Team:    Chanetta Marshall, NP  Tommye Standard, PA-C  Legrand Como "Oda [place]ts, [place]    Other Instructions

## 2020-07-04 ENCOUNTER — Other Ambulatory Visit: Payer: Self-pay | Admitting: *Deleted

## 2020-07-04 MED ORDER — RIVAROXABAN 20 MG PO TABS: ORAL_TABLET | ORAL | 1 refills | Status: DC

## 2020-07-04 NOTE — Telephone Encounter (Signed)
Prescription refill request for Xarelto received. Indication: afib  Last office visit: Charlcie Cradle, 12/29/2019 Scr: 0.92, 04/17/2020 Age: 70 yo  Weight: 111.1 kg  CrCl: 122 ml/min   Pt is on the correct dose of Xarelto per dosing criteria, prescription refill sent for Xarelto 20mg  daily.

## 2020-08-18 IMAGING — DX RIGHT KNEE - COMPLETE 4+ VIEW
5 series · 5 of 5 positions shown · non-contrast
Comparison: 01/29/2016

CLINICAL DATA: Patient states that he fell onto his knees on
09/19/18 after tripping over a piece of concrete, has bilateral knee
pains, left anterior knee pains and right popliteal knee pains, has
abrasion to left anterior knee, no other complaints

EXAM:
RIGHT KNEE - COMPLETE 4+ VIEW

[knee lat (1 of 2)]
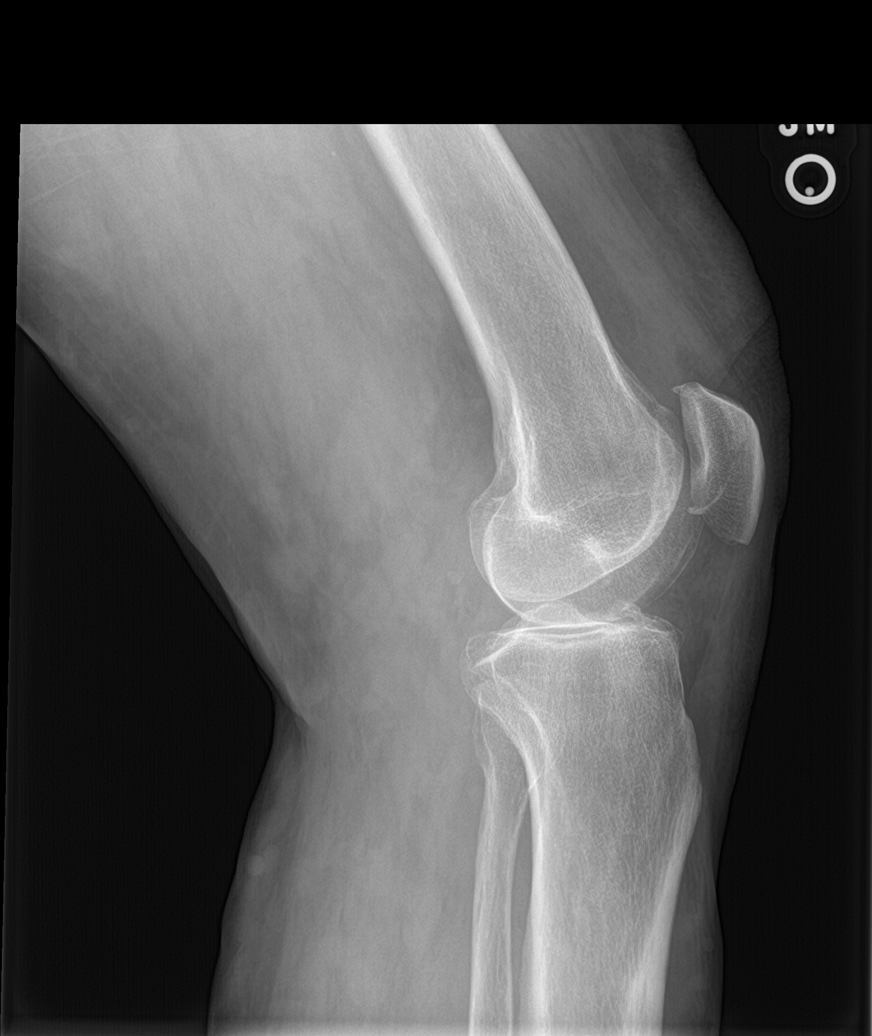

[knee sunrise]
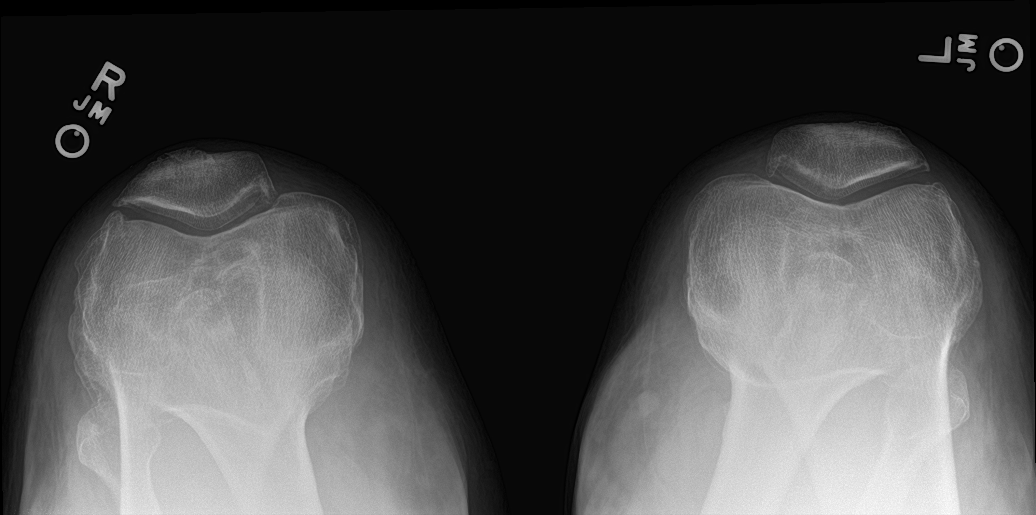

[knee ap bilat standing (1 of 2)]
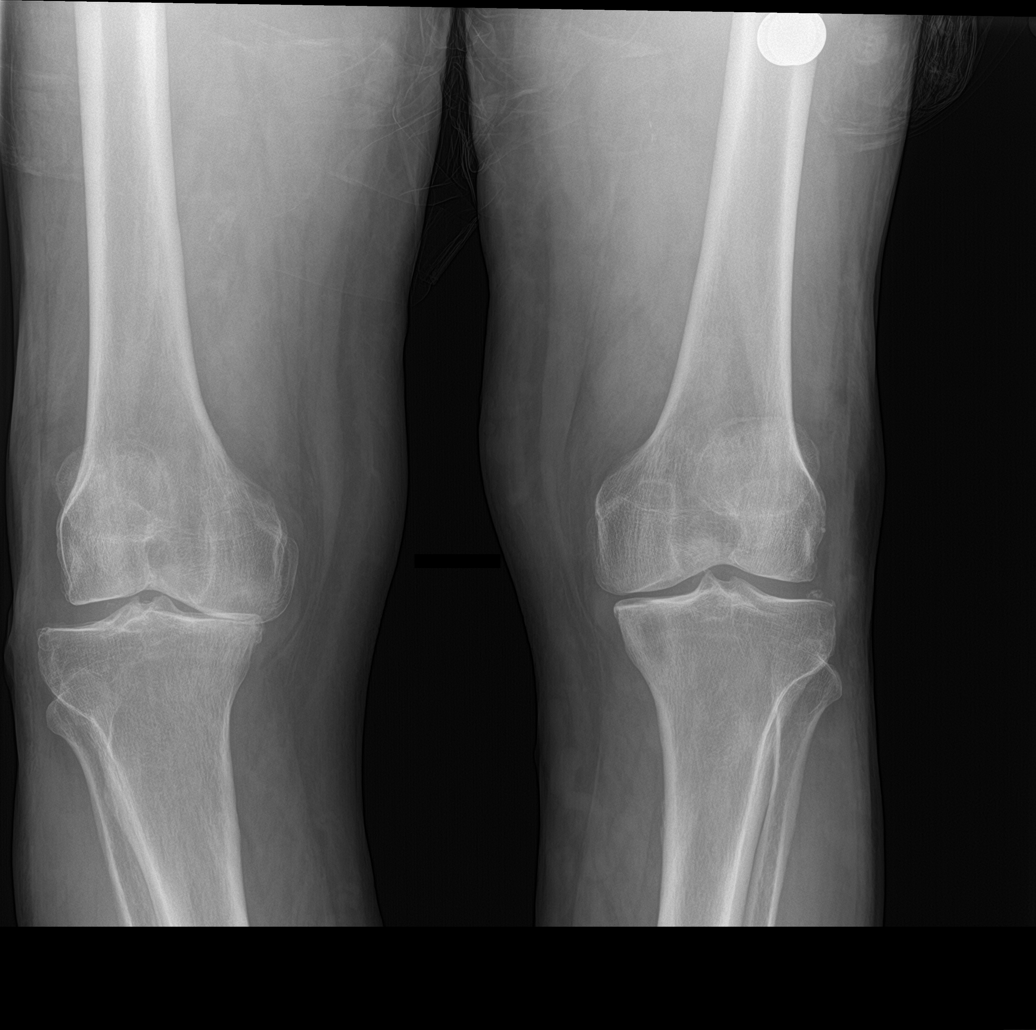

[knee ap bilat standing (2 of 2)]
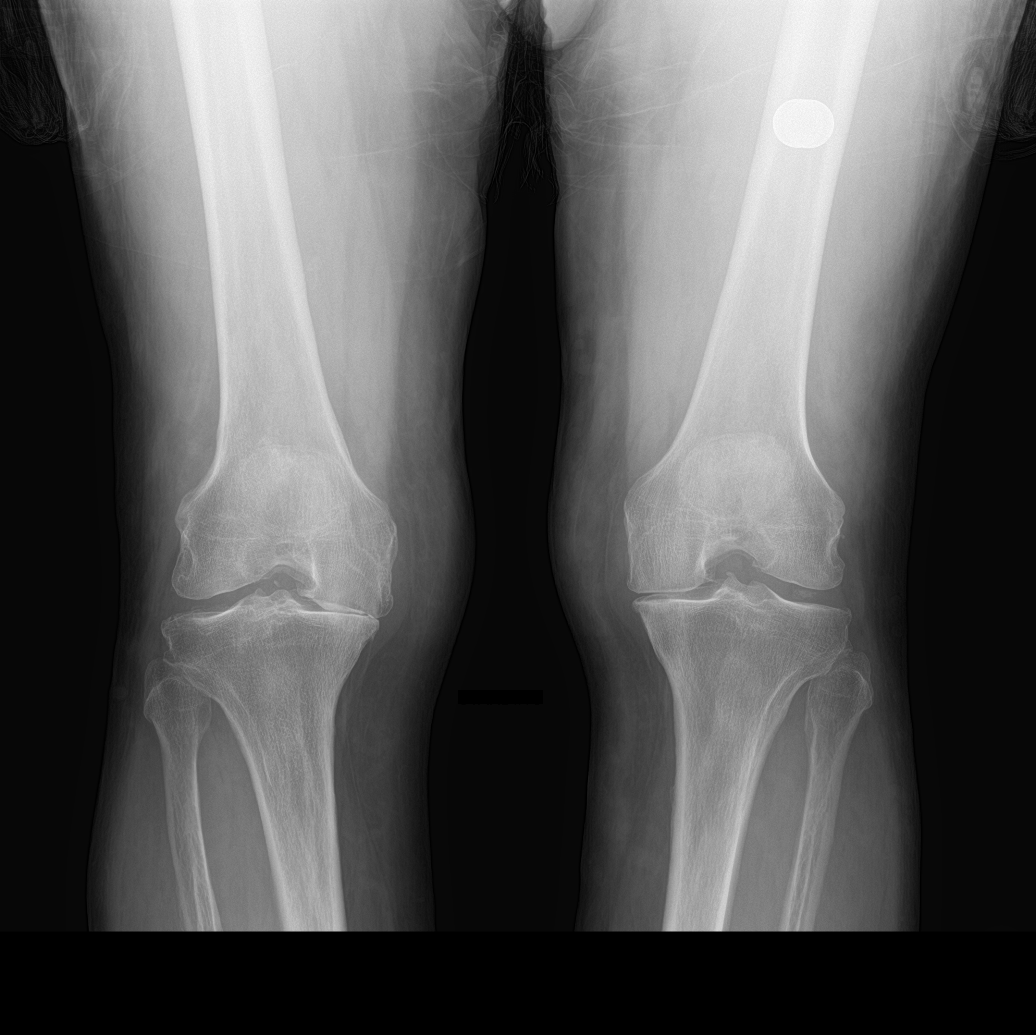

[knee lat (2 of 2)]
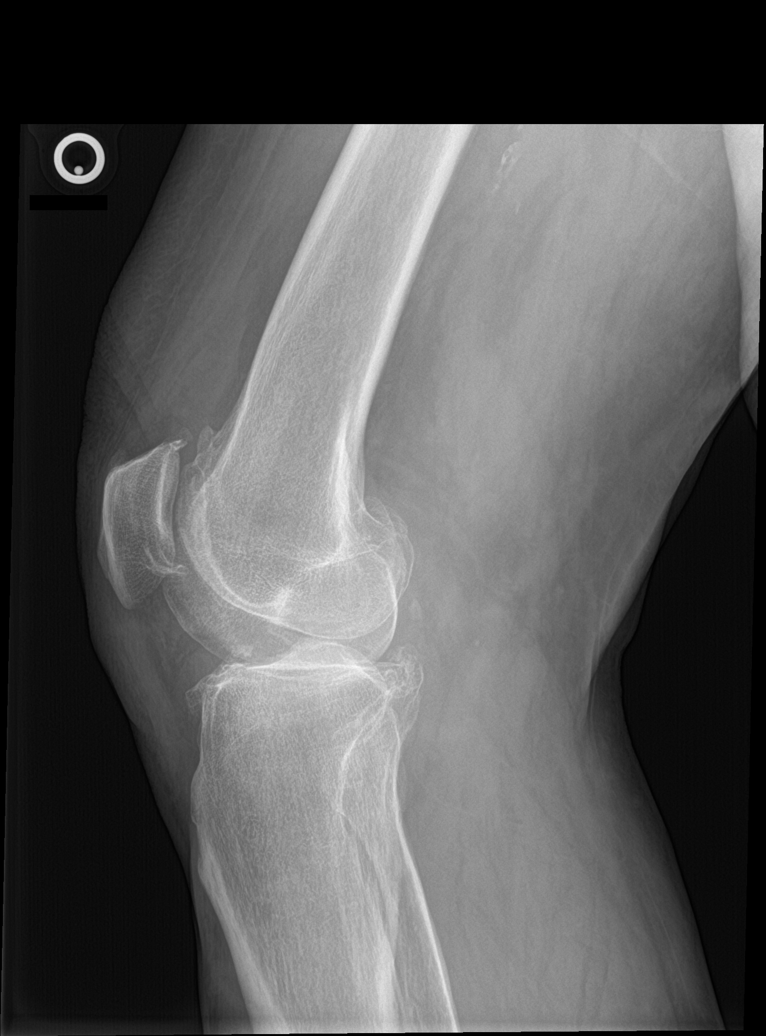

[5 of 5 positions shown; findings below may reference images not displayed]

FINDINGS: No fracture or bone lesion.

Marked narrowing of the medial joint space compartment. Marginal
osteophytes are noted all 3 compartments.

Small joint effusion.

Soft tissues are unremarkable.
IMPRESSION: 1. No fracture or acute finding.
2. Advanced osteoarthritis predominantly affecting the medial
compartment, which has slightly advanced in severity from the prior
radiographs. Small joint effusion.

## 2020-08-30 ENCOUNTER — Other Ambulatory Visit: Payer: Self-pay | Admitting: Physician Assistant

## 2020-08-30 NOTE — Telephone Encounter (Signed)
Prescription refill request for Xarelto received.  Indication: afib  Last office visit: Christian Ortiz, 04/17/2020 Weight: 111.1kg  Age: 70 yo  Scr: 0.92, 04/17/2020 CrCl:  187ml/min   Pt is on the correct dose of Xarelto per dosing criteria, prescription refill sent for Xarelto 20 mg daily.

## 2020-11-02 ENCOUNTER — Encounter: Payer: Self-pay | Admitting: Cardiology

## 2020-11-02 ENCOUNTER — Ambulatory Visit (INDEPENDENT_AMBULATORY_CARE_PROVIDER_SITE_OTHER): Payer: 59 | Admitting: Cardiology

## 2020-11-02 ENCOUNTER — Other Ambulatory Visit: Payer: Self-pay

## 2020-11-02 VITALS — BP 100/74 | HR 73 | Ht 72.0 in | Wt 244.4 lb

## 2020-11-02 DIAGNOSIS — I4819 Other persistent atrial fibrillation: Secondary | ICD-10-CM | POA: Diagnosis not present

## 2020-11-02 NOTE — Progress Notes (Signed)
Electrophysiology Office Note   Date:  11/02/2020   ID:  Christian Ortiz, Christian Ortiz [birth year], MRN [id number]  PCP:  Mackie Pai, PA-C  Cardiologist:  Cathlean Sauer MD [facility]) Primary Electrophysiologist:  Dr Curt Bears    CC: Follow up for atrial fibrillation s/p afib ablation.   History of Present Illness: Christian Ortiz is a 70 y.o. male who is being seen today for the evaluation of atrial fibrillation at the request of [facility]. Presenting today for electrophysiology evaluation.    He has a history significant for atrial fibrillation, COPD, hypertension, nonischemic cardiomyopathy, obesity, obstructive sleep apnea.  He presented emergency room 07/22/2017 with atrial fibrillation rapid rates.  He had an ejection fraction of 40 to 45%.  He was found to have 3+ mitral insufficiency with a normal valve on echo.  He was started on amiodarone and underwent TEE guided external cardioversion.  He is now status post atrial fibrillation ablation 11/13/2017.  He is remained in sinus rhythm and has been taken off of amiodarone.  Today, denies symptoms of palpitations, chest pain, shortness of breath, orthopnea, PND, lower extremity edema, claudication, dizziness, presyncope, syncope, bleeding, or neurologic sequela. The patient is tolerating medications without difficulties.  He has been feeling well.  He has no chest pain or shortness of breath.  He is noted no further episodes of atrial fibrillation and is overall happy with his control.  He was unfortunately diagnosed with prostate cancer.  He has had radiation and has seeds placed in his prostate.  He has been feeling well.  Past Medical History:  Diagnosis Date   Carpal tunnel syndrome    Bilateral   Chronic atrial fibrillation ([place]) 10/01/2017   Chronic obstructive pulmonary disease ([place]) 01/13/2014   Overview:  Mild.  [facility] is following.   Chronic systolic heart failure ([facility])    Elevated PSA 09/21/2015   Essential hypertension  04/25/2014   Hypertension, essential 10/01/2017   Non-rheumatic mitral regurgitation 07/23/2017   Noncompliance 08/01/2017   Nonischemic cardiomyopathy ([place]) 07/23/2017   Nonrheumatic mitral valve regurgitation 10/01/2017   Obesity (BMI 30-39.9) 02/15/2015   OSA (obstructive sleep apnea) 05/10/2014   Persistent atrial fibrillation ([place])    Prediabetes 09/27/2015   S/P laparoscopic appendectomy 02/15/2013   Sleep apnea                          DECIDED AGAINST CPAP 10/01/2017   Ulnar neuropathy    Past Surgical History:  Procedure Laterality Date   APPENDECTOMY  02/15/2013   ATRIAL FIBRILLATION ABLATION N/A 11/13/2017   Procedure: ATRIAL FIBRILLATION ABLATION;  Surgeon: Constance Haw, MD;  Location: [facility];  Service: Cardiovascular;  Laterality: N/A;   sessile colonic polyp N/A 02/15/2013     Current Outpatient Medications  Medication Sig Dispense Refill   atorvastatin (LIPITOR) 80 MG tablet Take 1 tablet (80 mg total) by mouth daily. 30 tablet 5   diclofenac Sodium (VOLTAREN) 1 % GEL Apply 2 g topically 4 (four) times daily. 350 g 2   fluticasone (FLONASE) 50 MCG/ACT nasal spray Place 2 sprays into both nostrils daily. 16 g 12   furosemide (LASIX) 40 MG tablet TAKE 1 TABLET BY MOUTH EVERY DAY 30 tablet 2   lisinopril (ZESTRIL) 10 MG tablet Take 1 tablet (10 mg total) by mouth daily. 30 tablet 3   metoprolol succinate (TOPROL-XL) 50 MG 24 hr tablet Take 1 tablet (50 mg total) by mouth daily. Take with  or immediately following a meal. 90 tablet 1   omeprazole (PRILOSEC) 20 MG capsule Take 20 mg by mouth daily.     rivaroxaban (XARELTO) 20 MG TABS tablet TAKE ONE TABLET BY MOUTH DAILY WITH SUPPER 30 tablet 5   No current facility-administered medications for this visit.    Allergies:   Patient has no known allergies.   Social History:  The patient  reports that he has never smoked. He has never used smokeless tobacco. He reports that he does not drink alcohol and does not  use drugs.   Family History:  The patient's family history includes COPD in his father; Cancer in his mother; Diabetes in his father; Hypertension in his father.   ROS:  Please see the history of present illness.   Otherwise, review of systems is positive for none.   All other systems are reviewed and negative.   PHYSICAL EXAM: VS:  BP 100/74   Pulse 73   Ht 6' (1.829 m)   Wt 244 lb 6.4 oz (110.9 kg)   SpO2 98%   BMI 33.15 kg/m  , BMI Body mass index is 33.15 kg/m. GEN: Well nourished, well developed, in no acute distress  HEENT: normal  Neck: no JVD, carotid bruits, or masses Cardiac: RRR; no murmurs, rubs, or gallops,no edema  Respiratory:  clear to auscultation bilaterally, normal work of breathing GI: soft, nontender, nondistended, + BS MS: no deformity or atrophy  Skin: warm and dry Neuro:  Strength and sensation are intact Psych: euthymic mood, full affect  EKG:  EKG is ordered today. Personal review of the ekg ordered shows sinus rhythm, rate 73, PACs  Recent Labs: 11/05/2019: ALT 12 04/17/2020: BUN 15; Creatinine, Ser 0.92; Hemoglobin 14.8; Platelets 273; Potassium 4.7; Sodium 141    Lipid Panel     Component Value Date/Time   CHOL 130 03/19/2019 1011   TRIG 53.0 03/19/2019 1011   HDL 53.50 03/19/2019 1011   CHOLHDL 2 03/19/2019 1011   VLDL 10.6 03/19/2019 1011   LDLCALC 66 03/19/2019 1011   LDLCALC 69 12/22/2017 1554   LDLDIRECT 70 08/03/2018 1621     Wt Readings from Last 3 Encounters:  11/02/20 244 lb 6.4 oz (110.9 kg)  04/17/20 245 lb (111.1 kg)  12/29/19 243 lb 6.4 oz (110.4 kg)      Other studies Reviewed: Additional studies/ records that were reviewed today include: TTE 07/23/17  Review of the above records today demonstrates:  The left ventricle is mildly dilated. There is mild concentric left ventricular hypertrophy. There is mild diffuse hypokinesis of the left ventricle. The left ventricular ejection fraction is moderately reduced  (40-45%). Grade I mild diastolic dysfunction; abnormal relaxation pattern. The left atrium is moderately dilated. There is mild (1+) tricuspid regurgitation. There is mild aortic valve thickening with trace regurgitation.   ASSESSMENT AND PLAN:  1.  Persistent atrial fibrillation: Status post ablation 11/13/2017.  CHA2DS2-VASc of 3.  Currently on Xarelto.  He has been taken off of amiodarone.  He fortunately remains in sinus rhythm.  He has had no further episodes of atrial fibrillation.  2.  Hypertension: Currently well controlled  3.  Nonischemic cardiomyopathy: Currently on optimal medical therapy.  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  4.  Obesity: Has changed his diet and has started to exercise.  Current medicines are reviewed at length with the patient today.   The patient does not have concerns regarding his medicines.  The following changes were made today: None  Labs/ tests ordered today include:  Orders Placed This Encounter  Procedures   EKG 12-Lead     Disposition:   FU with Emmalee Solivan 12 months  Signed, Lilibeth Opie Meredith Leeds, MD  11/02/2020 10:01 AM     [facility] [address] [place] [address] [phone] (office) [phone] (fax)

## 2021-03-06 ENCOUNTER — Other Ambulatory Visit: Payer: Self-pay | Admitting: Physician Assistant

## 2021-03-06 NOTE — Telephone Encounter (Signed)
Pt last saw Dr Curt Bears 11/02/20, last labs 07/18/20 Creat 0.86, age 71, weight 110.9kg, CrCl 128.95, based on CrCl pt is on appropriate dosage of Xarelto 20mg  QD.  Will refill rx.

## 2021-06-27 NOTE — Progress Notes (Signed)
? ?Cardiology Office Note ?Date:  06/29/2021  ?Patient ID:  Christian Ortiz, Christian Ortiz Apr 04, 1950, MRN [id number] ?PCP:  Christian Pai, PA-C  ?Cardiologist/Electrophysiologist: Dr. Curt Bears ? ?  ?Chief Complaint:   30mo? ?History of Present Illness: ?EKhaliel Moreyis a 71y.o. male with history of COPD, HTN,  (suspect tachy-mediated), AFib obesity, OSA >> he denies this.  Mentions at one time did have CPAP sent to him but was intolerant/didnt work, sent it back.  He denies symptoms of sleep apnea.  ? ? ?He comes in today to be seen for [name], Feb 2019.  At that time, noted he had gained 20lbs in the prior few months, had no c/o, maintaining SR off amiodarone post ablation. ?Encouraged weight loss and exercise. ? ?I saw him Nov 2021 ?He is doing well.  Mentions that in the last couple weeks at work particularly he is gettig winded with activities that had not previously.  Others have mentioned to him as well, that  He looks SOB with heavier activities. 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 ?He also mentions that he has been on light duty since March after a work accident that fractured his leg/knee. 255mogo got back on the job but not quite back to full duty until recently. ?He denies any kind of CP ?He does not think he has had any afib and seldom feels palpitations or a sense that he may go into AF. ?No dizzy spells, near syncope or syncope. ?He was at the urologist recently and they mentioned his heart beat irregular and their machine had a hard tome getting his BP. ?With this and his DOE his wife urged he come in to get seen. ?Suspected deconditioning from his baseline after his leg injury, planned to update his echo, check labs.  Exam did not suggest volume OL, EKG looked OK ? ?TTE noted LVEF 55-60%, no WMA, grade I DD, no VHD of any significance.  He was recommended to continue to exercise to his capacity and if not improved, let usKoreanow and would plan stress  testing. ?CBC with stable H/H ? ?I saw him 04/17/20 ?He is doing OK, unfortunately since our last visit was dx with prostate cancer.  He has had treatment with Lupron, states that if this showed reduction in size enough he would be a candidate for seed implants. ?He is working and feels like he is at his bseline exertional capacity ?No DOE that he has appreciated anymore ?He and his son plan to start exercising together soon. ?No CP, no palpitations or cardiac awareness. ?No dizzy spells, near syncope or syncope. ?No bleeding or signs of bleeding ?No changes were made ? ?He saw Dr. CaCurt Bears/8/22, still doing well, though recently found with prostate CA > s/p seed implant and XRT ?No changes were made. ? ?TODAY ?He is doing well ?Works for [employer]?He with increased pace/up/down loading will get a little winded, but otherwise  has great exertional capacity. ?Some minimal edema comes/goes, he has not had to use any extra lasix.  Reports that he is taking '20mg'$  day, but ahd '40mg'$  tabs if he needs them ?Infrequently he will get a sense that he is about to have afib and will just relax a little and that feeling settles away. ?He does not think he has had AFib. ?No bleeding or signs of bleeding ?No near syncope or syncope. ?No CP ? ? ?AFib hx ?Diagnosed 2019 ?Amiodarone 2019 ?PVI ablation  11/13/2017 ?Off amiodarone post ablation, March 2020 ? ? ?Past Medical History:  ?Diagnosis Date  ? Carpal tunnel syndrome   ? Bilateral  ? Chronic atrial fibrillation ([place]) 10/01/2017  ? Chronic obstructive pulmonary disease ([place]) 01/13/2014  ? Overview:  Mild.  [facility] is following.  ? Chronic systolic heart failure ([place])   ? Elevated PSA 09/21/2015  ? Essential hypertension 04/25/2014  ? Hypertension, essential 10/01/2017  ? Non-rheumatic mitral regurgitation 07/23/2017  ? Noncompliance 08/01/2017  ? Nonischemic cardiomyopathy ([place]) 07/23/2017  ? Nonrheumatic mitral valve regurgitation 10/01/2017  ? Obesity (BMI  30-39.9) 02/15/2015  ? OSA (obstructive sleep apnea) 05/10/2014  ? Persistent atrial fibrillation ([place])   ? Prediabetes 09/27/2015  ? S/P laparoscopic appendectomy 02/15/2013  ? Sleep apnea                          DECIDED AGAINST CPAP 10/01/2017  ? Ulnar neuropathy   ? ? ?Past Surgical History:  ?Procedure Laterality Date  ? APPENDECTOMY  02/15/2013  ? ATRIAL FIBRILLATION ABLATION N/A 11/13/2017  ? Procedure: ATRIAL FIBRILLATION ABLATION;  Surgeon: Constance Haw, MD;  Location: [facility];  Service: Cardiovascular;  Laterality: N/A;  ? sessile colonic polyp N/A 02/15/2013  ? ? ?Current Outpatient Medications  ?Medication Sig Dispense Refill  ? atorvastatin (LIPITOR) 80 MG tablet Take 1 tablet (80 mg total) by mouth daily. 30 tablet 5  ? diclofenac Sodium (VOLTAREN) 1 % GEL Apply 2 g topically 4 (four) times daily. 350 g 2  ? fluticasone (FLONASE) 50 MCG/ACT nasal spray Place 2 sprays into both nostrils daily. 16 g 12  ? furosemide (LASIX) 40 MG tablet TAKE 1 TABLET BY MOUTH EVERY DAY 30 tablet 2  ? lisinopril (ZESTRIL) 10 MG tablet Take 1 tablet (10 mg total) by mouth daily. 30 tablet 3  ? metoprolol succinate (TOPROL-XL) 50 MG 24 hr tablet Take 1 tablet (50 mg total) by mouth daily. Take with or immediately following a meal. 90 tablet 1  ? omeprazole (PRILOSEC) 20 MG capsule Take 20 mg by mouth daily.    ? tamsulosin (FLOMAX) 0.4 MG CAPS capsule Take 0.8 mg by mouth daily.    ? XARELTO 20 MG TABS tablet TAKE ONE TABLET BY MOUTH DAILY WITH SUPPER 30 tablet 5  ? ?No current facility-administered medications for this visit.  ? ? ?Allergies:   Patient has no known allergies.  ? ?Social History:  The patient  reports that he has never smoked. He has never used smokeless tobacco. He reports that he does not drink alcohol and does not use drugs.  ? ?Family History:  The patient's family history includes COPD in his father; Cancer in his mother; Diabetes in his father; Hypertension in his father. ? ?ROS:  Please  see the history of present illness.    ?All other systems are reviewed and otherwise negative.  ? ?PHYSICAL EXAM:  ?VS:  BP 120/76   Pulse 60   Ht '6\' 1"'$  (1.854 m)   Wt 248 lb 9.6 oz (112.8 kg)   SpO2 95%   BMI 32.80 kg/m?  BMI: Body mass index is 32.8 kg/m?. ?Well nourished, well developed, in no acute distress ?HEENT: normocephalic, atraumatic ?Neck: no JVD, carotid bruits or masses ?Cardiac:   RRR; a few extrasystoles, no significant murmurs, no rubs, or gallops ?Lungs:   CTA b/l, no wheezing, rhonchi or rales ?Abd: soft, nontender, obese ?MS: no deformity or atrophy ?Ext:  no edema ?  Skin: warm and dry, no rash ?Neuro:  No gross deficits appreciated ?Psych: euthymic mood, full affect ? ? ? ? ?EKG:  Done today and reviewed by myself shows  ?Not done today ? ? ?01/25/2020: TTE ?IMPRESSIONS  ? 1. Left ventricular ejection fraction, by estimation, is 55 to 60%. The  ?left ventricle has normal function. The left ventricle has no regional  ?wall motion abnormalities. Left ventricular diastolic parameters are  ?consistent with Grade I diastolic  ?dysfunction (impaired relaxation).  ? 2. Right ventricular systolic function is normal. The right ventricular  ?size is normal.  ? 3. The mitral valve is normal in structure. Mild mitral valve  ?regurgitation. No evidence of mitral stenosis.  ? 4. The aortic valve is normal in structure. Aortic valve regurgitation is  ?trivial. Mild aortic valve sclerosis is present, with no evidence of  ?aortic valve stenosis.  ? 5. The inferior vena cava is normal in size with greater than 50%  ?respiratory variability, suggesting right atrial pressure of 3 mmHg. ? ? ?11/13/2017: EPS/Ablation ?CONCLUSIONS: ?1. Sinus rhythm upon presentation.   ?2. Successful electrical isolation and anatomical encircling of all four pulmonary veins with radiofrequency current. ?3. No inducible arrhythmias following ablation both on and off of Isuprel ?4. No early apparent complications. ? ? ? ?TTE 07/23/17    ?The left ventricle is mildly dilated. ?There is mild concentric left ventricular hypertrophy. ?There is mild diffuse hypokinesis of the left ventricle. ?The left ventricular ejection fraction is moderately reduced

## 2021-06-29 ENCOUNTER — Ambulatory Visit (INDEPENDENT_AMBULATORY_CARE_PROVIDER_SITE_OTHER): Payer: 59 | Admitting: Physician Assistant

## 2021-06-29 ENCOUNTER — Encounter: Payer: Self-pay | Admitting: Physician Assistant

## 2021-06-29 VITALS — BP 120/76 | HR 60 | Ht 73.0 in | Wt 248.6 lb

## 2021-06-29 DIAGNOSIS — Z79899 Other long term (current) drug therapy: Secondary | ICD-10-CM | POA: Diagnosis not present

## 2021-06-29 DIAGNOSIS — I428 Other cardiomyopathies: Secondary | ICD-10-CM

## 2021-06-29 DIAGNOSIS — I10 Essential (primary) hypertension: Secondary | ICD-10-CM | POA: Diagnosis not present

## 2021-06-29 DIAGNOSIS — I4819 Other persistent atrial fibrillation: Secondary | ICD-10-CM

## 2021-06-29 NOTE — Patient Instructions (Signed)
Medication Instructions:  ? ?Your physician recommends that you continue on your current medications as directed. Please refer to the Current Medication list given to you today. ? ?*If you need a refill on your cardiac medications before your next appointment, please call your pharmacy* ? ? ?Lab Work:  BMET AND CBC TODAY  ? ? ?If you have labs (blood work) drawn today and your tests are completely normal, you will receive your results only by: ?MyChart Message (if you have MyChart) OR ?A paper copy in the mail ?If you have any lab test that is abnormal or we need to change your treatment, we will call you to review the results. ? ? ?Testing/Procedures: NONE ORDERED  TODAY ? ? ? ? ?Follow-Up: ?At [facility], you and your health needs are our priority.  As part of our continuing mission to provide you with exceptional heart care, we have created designated Provider Care Teams.  These Care Teams include your primary Cardiologist (physician) and Advanced Practice Providers (APPs -  Physician Assistants and Nurse Practitioners) who all work together to provide you with the care you need, when you need it. ? ?We recommend signing up for the patient portal called "MyChart".  Sign up information is provided on this After Visit Summary.  MyChart is used to connect with patients for Virtual Visits (Telemedicine).  Patients are able to view lab/test results, encounter notes, upcoming appointments, etc.  Non-urgent messages can be sent to your provider as well.   ?To learn more about what you can do with MyChart, go to [email or web address].   ? ?Your next appointment:   ?6 month(s) ? ?The format for your next appointment:   ?In Person ? ?Provider:   ?You may see [name], MD or one of the following Advanced Practice Providers on your designated Care Team:   ?Tommye Standard, PA-C ? ? ? :1}  ? ? ?Other Instructions ? ? ?Important Information About Sugar ? ? ? ? ?  ?

## 2021-06-30 LAB — BASIC METABOLIC PANEL
BUN/Creatinine Ratio: 26 — ABNORMAL HIGH (ref 10–24)
BUN: 18 mg/dL (ref 8–27)
CO2: 24 mmol/L (ref 20–29)
Calcium: 9.6 mg/dL (ref 8.6–10.2)
Chloride: 103 mmol/L (ref 96–106)
Creatinine, Ser: 0.7 mg/dL — ABNORMAL LOW (ref 0.76–1.27)
Glucose: 99 mg/dL (ref 70–99)
Potassium: 4.6 mmol/L (ref 3.5–5.2)
Sodium: 139 mmol/L (ref 134–144)
eGFR: 100 mL/min/{1.73_m2} (ref >59)

## 2021-06-30 LAB — CBC
Hematocrit: 40.4 % (ref 37.5–51.0)
Hemoglobin: 13.4 g/dL (ref 13.0–17.7)
MCH: 31.2 pg (ref 26.6–33.0)
MCHC: 33.2 g/dL (ref 31.5–35.7)
MCV: 94 fL (ref 79–97)
Platelets: 257 10*3/uL (ref 150–450)
RBC: 4.3 x10E6/uL (ref 4.14–5.80)
RDW: 13 % (ref 11.6–15.4)
WBC: 5.9 10*3/uL (ref 3.4–10.8)

## 2021-10-01 IMAGING — DX DG CHEST 2V
2 series · 2 of 2 positions shown · non-contrast
Comparison: Chest x-ray 03/28/2016.

CLINICAL DATA: Dysphagia.

EXAM:
CHEST - 2 VIEW

[chest pa]
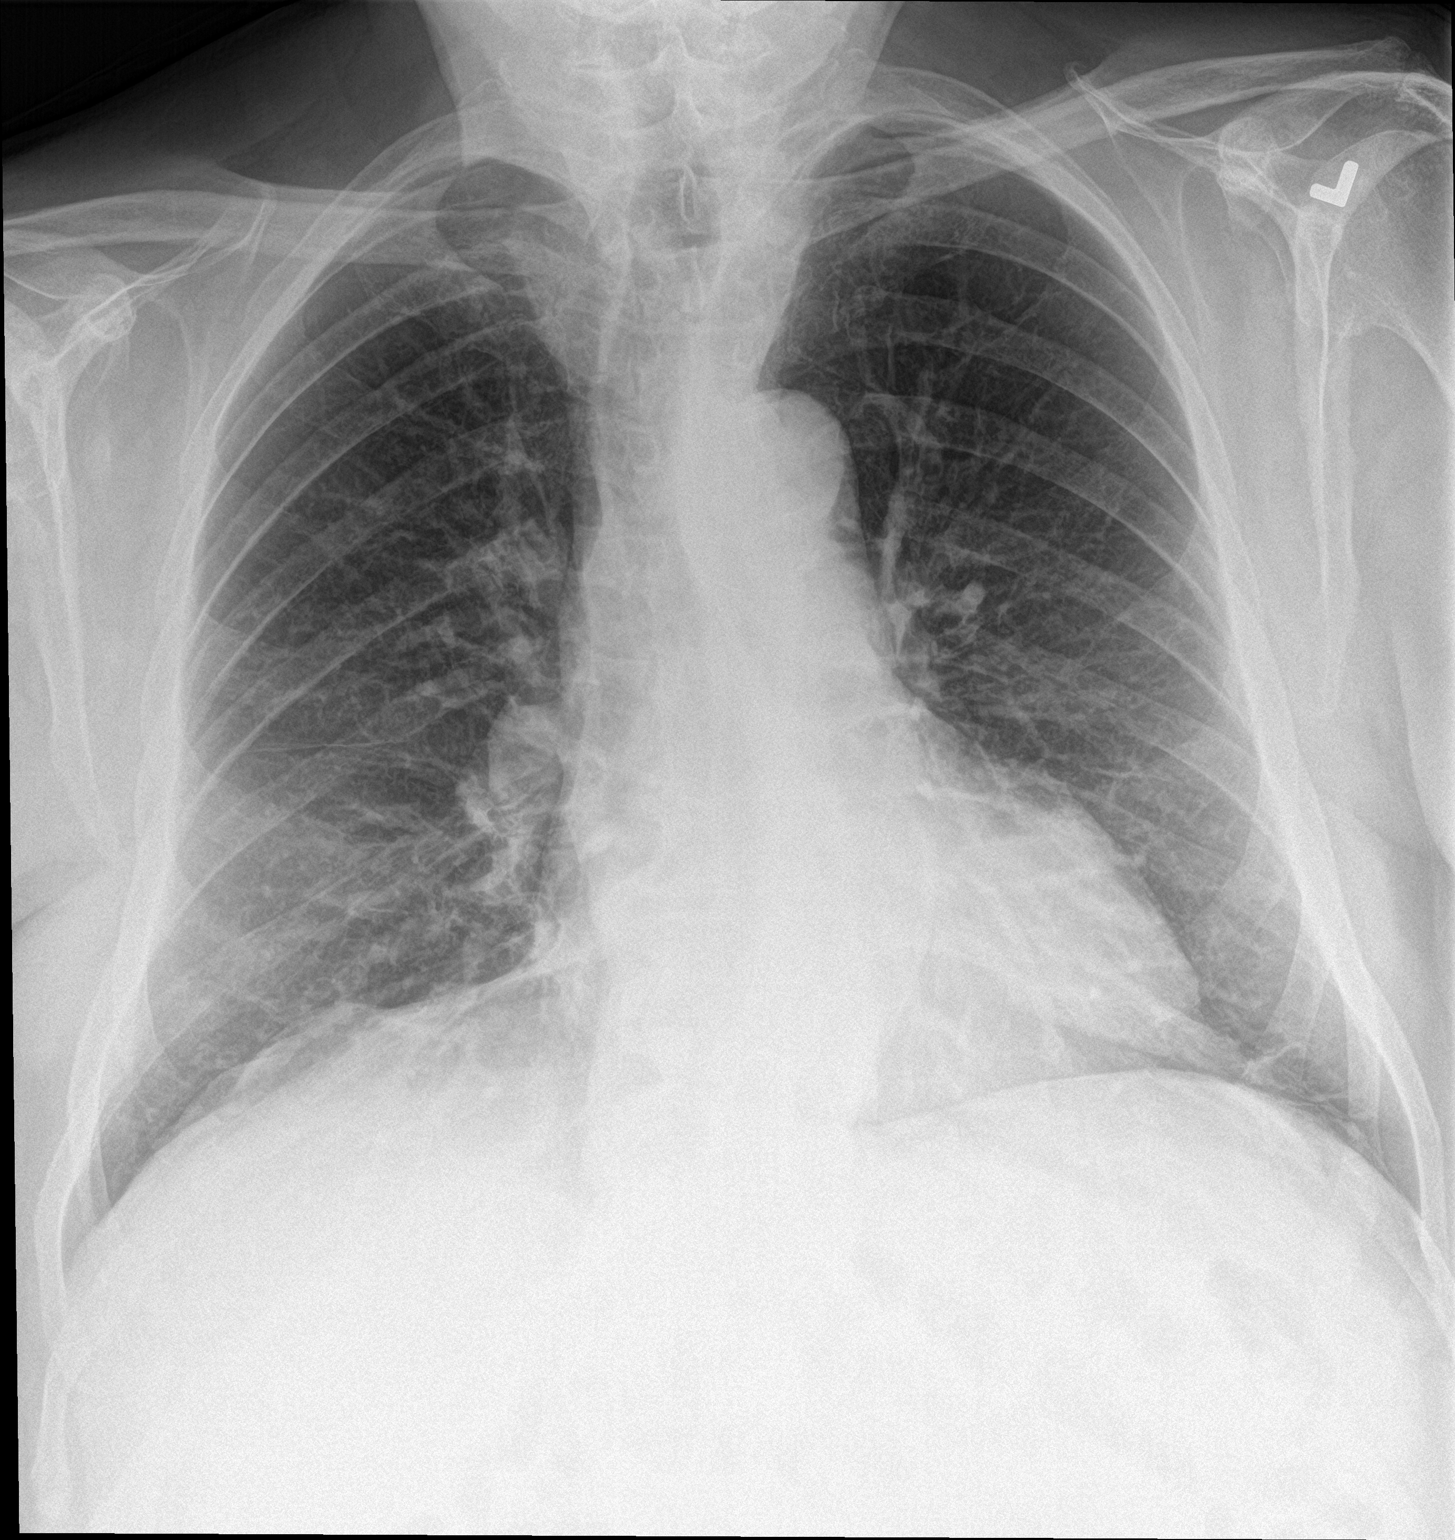

[chest lat]
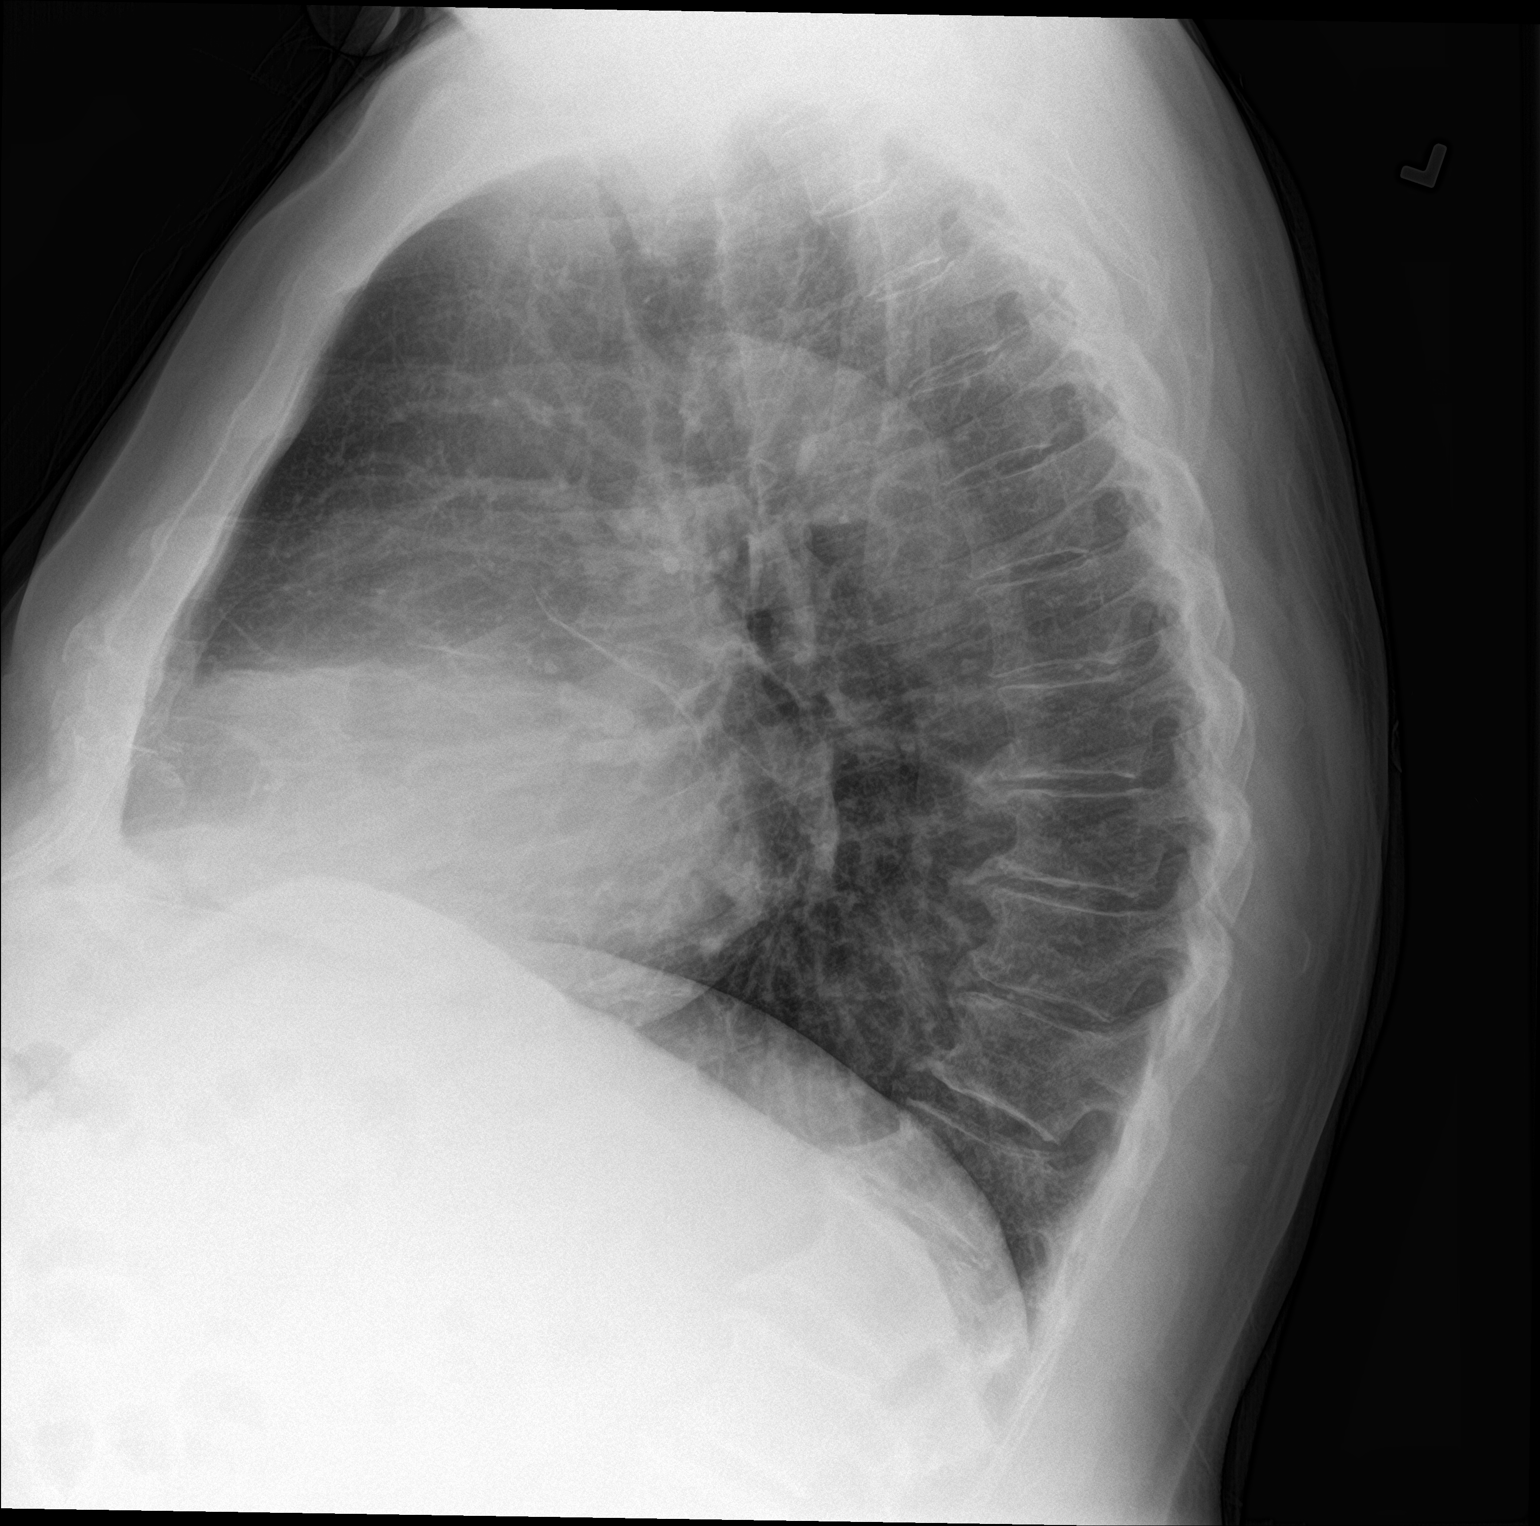

[2 of 2 positions shown; findings below may reference images not displayed]

FINDINGS: Mediastinum hilar structures normal. Cardiomegaly. No pulmonary
venous congestion. Stable mild bilateral interstitial prominence,
most likely chronic. No pleural effusion or pneumothorax.
Degenerative change thoracic spine.
IMPRESSION: 1.  Cardiomegaly.  No pulmonary venous congestion.

2. Stable mild bilateral interstitial prominence, most likely
chronic.

## 2022-02-14 ENCOUNTER — Telehealth: Payer: Self-pay | Admitting: *Deleted

## 2022-02-14 DIAGNOSIS — R931 Abnormal findings on diagnostic imaging of heart and coronary circulation: Secondary | ICD-10-CM

## 2022-02-14 NOTE — Telephone Encounter (Signed)
   Pre-operative Risk Assessment    Patient Name: Christian Ortiz  DOB: Sep 16, 1950 MRN: [id number]      Request for Surgical Clearance    Procedure:   COLONSCOPY  Date of Surgery:  Clearance TBD                                 Surgeon:  DR. Jolivue or Practice Name:  [facility] Phone number:  [phone] Fax number:  [phone]   Type of Clearance Requested:   - Pharmacy:  Hold Rivaroxaban (Xarelto) X'S 2-3 DAYS   Type of Anesthesia:  Not Indicated   Additional requests/questions:    Signed, Jeanann Lewandowsky   02/14/2022, 4:20 PM

## 2022-02-15 DIAGNOSIS — R931 Abnormal findings on diagnostic imaging of heart and coronary circulation: Secondary | ICD-10-CM | POA: Insufficient documentation

## 2022-02-15 NOTE — Telephone Encounter (Signed)
   Name: Christian Ortiz  DOB: 05-27-1950  MRN: [id number]  Primary Cardiologist: None   Preoperative team, please contact this patient and set up a phone call appointment for further preoperative risk assessment. Please obtain consent and complete medication review. Thank you for your help.  I confirm that guidance regarding antiplatelet and oral anticoagulation therapy has been completed and, if necessary, noted below.  Pharmacy has addressed anticoagulation request.   Deberah Pelton, NP 02/15/2022, 9:55 AM [place]

## 2022-02-15 NOTE — Telephone Encounter (Signed)
Pt has appt 02/27/22 with Tommye Standard, PAC. I will add pre op clearance needed to appt notes . I will update the requesting office.

## 2022-02-15 NOTE — Telephone Encounter (Signed)
Patient with diagnosis of afib on Xarelto for anticoagulation.    Procedure: colonoscopy Date of procedure: TBD  CHA2DS2-VASc Score = 4  This indicates a 4.8% annual risk of stroke. The patient's score is based upon: CHF History: 1 HTN History: 1 Diabetes History: 0 Stroke History: 0 Vascular Disease History: 1 Age Score: 1 Gender Score: 0   CrCl >165m/min Platelet count 235K  Per office protocol, patient can hold Xarelto for 2-3 days prior to procedure as requested.    **This guidance is not considered finalized until pre-operative APP has relayed final recommendations.**

## 2022-02-25 NOTE — Progress Notes (Addendum)
Cardiology Office Note Date:  02/27/2022  Patient ID:  Christian Ortiz, Christian Ortiz 1950-12-30, MRN [id number] PCP:  [name], [name], MD  Cardiologist:  None Electrophysiologist: [name], MD   Chief Complaint: 39mo follow-up  History of Present Illness: EKirtis Challisis a 72y.o. male with PMH notable for AF, COPD, HTN, NICM (suspect tachy-mediated), ?OSA, Prostate Ca; seen today for Will MMeredith Leeds MD for routine electrophysiology followup. Since last being seen in our clinic the patient reports doing well.   Last saw PA UCharlcie Cradle5/2023, pretty good exertional capacity working at [employer] Edema well-controlled with '20mg'$  lasix daily; has some pre-AF sensation, but will relax and feeling settles. Did not think he had any AF episodes.  [facility] Recently fell while walking and passed brachytherapy seed and had blood in urine.   Today, he continues to do well. Still having pre-AF sensation, especially during stressful moments. When he rests, sensation goes away.   .  he denies chest pain, palpitations, dyspnea, PND, orthopnea, nausea, vomiting, dizziness, syncope, edema, weight gain, or early satiety.    Except for blood in urine with passing of seed, no other bleeding concerns.  He has colonoscopy planned soon.   AFib History: Diagnosed 2019 - amiodarone PVI ablation 11/13/2017 Off amiodarone post ablation, March 2020  Past Medical History:  Diagnosis Date   Carpal tunnel syndrome    Bilateral   Chronic atrial fibrillation ([facility] 10/01/2017   Chronic obstructive pulmonary disease ([facility] 01/13/2014   Overview:  Mild.  [facility] is following.   Chronic systolic heart failure ([facility])    Elevated PSA 09/21/2015   Essential hypertension 04/25/2014   Hypertension, essential 10/01/2017   Non-rheumatic mitral regurgitation 07/23/2017   Noncompliance 08/01/2017   Nonischemic cardiomyopathy ([facility] 07/23/2017   Nonrheumatic mitral  valve regurgitation 10/01/2017   Obesity (BMI 30-39.9) 02/15/2015   OSA (obstructive sleep apnea) 05/10/2014   Persistent atrial fibrillation ([place]    Prediabetes 09/27/2015   S/P laparoscopic appendectomy 02/15/2013   Sleep apnea                          DECIDED AGAINST CPAP 10/01/2017   Ulnar neuropathy     Past Surgical History:  Procedure Laterality Date   APPENDECTOMY  02/15/2013   ATRIAL FIBRILLATION ABLATION N/A 11/13/2017   Procedure: ATRIAL FIBRILLATION ABLATION;  Surgeon: CConstance Haw MD;  [facility];  Service: Cardiovascular;  Laterality: N/A;   sessile colonic polyp N/A 02/15/2013    Current Outpatient Medications  Medication Sig Dispense Refill   atorvastatin (LIPITOR) 80 MG tablet Take 1 tablet (80 mg total) by mouth daily. 30 tablet 5   diclofenac Sodium (VOLTAREN) 1 % GEL Apply 2 g topically 4 (four) times daily. 350 g 2   fluticasone (FLONASE) 50 MCG/ACT nasal spray Place 2 sprays into both nostrils daily. 16 g 12   furosemide (LASIX) 40 MG tablet TAKE 1 TABLET BY MOUTH EVERY DAY 30 tablet 2   lisinopril (ZESTRIL) 10 MG tablet Take 1 tablet (10 mg total) by mouth daily. 30 tablet 3   metoprolol succinate (TOPROL-XL) 50 MG 24 hr tablet Take 1 tablet (50 mg total) by mouth daily. Take with or immediately following a meal. 90 tablet 1   tamsulosin (FLOMAX) 0.4 MG CAPS capsule Take 0.8 mg by mouth daily.     tamsulosin (FLOMAX) 0.4 MG CAPS capsule Take 0.4 mg  by mouth 2 (two) times daily. Pt takes 1 tab 0.'4mg'$  in the mornings, and 1 tab 0.4 mg at night.     rivaroxaban (XARELTO) 20 MG TABS tablet Take 1 tablet (20 mg total) by mouth daily with supper. 90 tablet 3   No current facility-administered medications for this visit.    Allergies:   Patient has no known allergies.   Social History:  The patient  reports that he has never smoked. He has never used smokeless tobacco. He reports that he does not drink alcohol and does not use drugs.   Family  History:  The patient's family history includes COPD in his father; Cancer in his mother; Diabetes in his father; Hypertension in his father.  ROS:  Please see the history of present illness. All other systems are reviewed and otherwise negative.   PHYSICAL EXAM:  VS:  BP 128/86   Pulse 63   Ht '6\' 1"'$  (1.854 m)   Wt 242 lb 3.2 oz (109.9 kg)   SpO2 96%   BMI 31.95 kg/m  BMI: Body mass index is 31.95 kg/m.  GEN- The patient is well appearing, alert and oriented x 3 today.   HEENT: normocephalic, atraumatic; sclera clear, conjunctiva pink; hearing intact; oropharynx clear; neck supple, no JVP Lungs- Clear to ausculation bilaterally, normal work of breathing.  No wheezes, rales, rhonchi Heart- Regular rate with early ectopic beats; no murmurs, rubs or gallops, PMI not laterally displaced GI- soft, non-tender, non-distended, bowel sounds present, no hepatosplenomegaly Extremities- No peripheral edema. no clubbing or cyanosis; DP/PT/radial pulses 2+ bilaterally MS- no significant deformity or atrophy Skin- warm and dry, no rash or lesion Psych- euthymic mood, full affect Neuro- strength and sensation are intact   EKG is ordered. Personal review of EKG from today shows:  SR with PAC, rate 63bpm  Recent Labs: 06/29/2021: BUN 18; Creatinine, Ser 0.70; Hemoglobin 13.4; Platelets 257; Potassium 4.6; Sodium 139  No results found for requested labs within last 365 days.   CrCl cannot be calculated (Patient's most recent lab result is older than the maximum 21 days allowed.).   Wt Readings from Last 3 Encounters:  02/27/22 242 lb 3.2 oz (109.9 kg)  06/29/21 248 lb 9.6 oz (112.8 kg)  11/02/20 244 lb 6.4 oz (110.9 kg)     Additional studies reviewed include: Previous EP notes.   Echo (01/25/2020) IMPRESSIONS   1. Left ventricular ejection fraction, by estimation, is 55 to 60%. The left ventricle has normal function. The left ventricle has no regional wall motion abnormalities. Left  ventricular diastolic parameters are consistent with Grade I diastolic dysfunction (impaired relaxation).   2. Right ventricular systolic function is normal. The right ventricular size is normal.   3. The mitral valve is normal in structure. Mild mitral valve regurgitation. No evidence of mitral stenosis.   4. The aortic valve is normal in structure. Aortic valve regurgitation is trivial. Mild aortic valve sclerosis is present, with no evidence of aortic valve stenosis.   5. The inferior vena cava is normal in size with greater than 50% respiratory variability, suggesting right atrial pressure of 3 mmHg.     EPS/Ablation (11/13/2017)  CONCLUSIONS: 1. Sinus rhythm upon presentation.   2. Successful electrical isolation and anatomical encircling of all four pulmonary veins with radiofrequency current. 3. No inducible arrhythmias following ablation both on and off of Isuprel 4. No early apparent complications.   ASSESSMENT AND PLAN:  #) parox AFib s/p ablation Off amiodarone since ablation 2019 Doing  well without recurrence   #) Hypercoagulable d/t Afib CHA2DS2-VASc Score = 4 [CHF History: 1, HTN History: 1, Diabetes History: 0, Stroke History: 0, Vascular Disease History: 1, Age Score: 1, Gender Score: 0].  Therefore, the patient's annual risk of stroke is 4.8 %.    AC: Xarelto '20mg'$  daily, appropriately dosed CrCl 158+  #) NICM Recovered EF   #) HTN at goal today.  Recommend checking blood pressures 1-2 times per week at home and recording the values.  Recommend bringing these recordings to the primary care physician.   #) Pre-operative cardiology risk assessment Acceptable risk to stop xarelto prior to colonoscopy, per pharmacy note 02/14/2022 RCRI for colonoscopy is 0.9% / Low cardiac risk score Patient is at acceptable risk to proceed with colonoscopy without any further workup     Current medicines are reviewed at length with the patient today.   The patient does not have  concerns regarding his medicines.  The following changes were made today:  none  Labs/ tests ordered today include:  Orders Placed This Encounter  Procedures   EKG 12-Lead     Disposition: Follow up with Dr. Curt Bears in 12 months   Signed, Mamie Levers, NP  02/27/22 12:25 PM   [place] [address] [phone] (office)  [phone] (fax)

## 2022-02-27 ENCOUNTER — Ambulatory Visit: Payer: 59 | Attending: Physician Assistant | Admitting: Cardiology

## 2022-02-27 ENCOUNTER — Encounter: Payer: Self-pay | Admitting: Cardiology

## 2022-02-27 VITALS — BP 128/86 | HR 63 | Ht 73.0 in | Wt 242.2 lb

## 2022-02-27 DIAGNOSIS — Z79899 Other long term (current) drug therapy: Secondary | ICD-10-CM | POA: Diagnosis not present

## 2022-02-27 DIAGNOSIS — I480 Paroxysmal atrial fibrillation: Secondary | ICD-10-CM | POA: Diagnosis not present

## 2022-02-27 DIAGNOSIS — D6869 Other thrombophilia: Secondary | ICD-10-CM

## 2022-02-27 DIAGNOSIS — I10 Essential (primary) hypertension: Secondary | ICD-10-CM

## 2022-02-27 DIAGNOSIS — I428 Other cardiomyopathies: Secondary | ICD-10-CM | POA: Diagnosis not present

## 2022-02-27 MED ORDER — RIVAROXABAN 20 MG PO TABS: 20.0000 mg | ORAL_TABLET | Freq: Every day | ORAL | 3 refills | Status: DC

## 2022-02-27 NOTE — Patient Instructions (Signed)
Medication Instructions:   Your physician recommends that you continue on your current medications as directed. Please refer to the Current Medication list given to you today.  *If you need a refill on your cardiac medications before your next appointment, please call your pharmacy*   Lab Work: [place]   If you have labs (blood work) drawn today and your tests are completely normal, you will receive your results only by: [place] (if you have MyChart) OR A paper copy in the mail If you have any lab test that is abnormal or we need to change your treatment, we will call you to review the results.   Testing/Procedures: NONE ORDERED  TODAY   Follow-Up: At [facility], you and your health needs are our priority.  As part of our continuing mission to provide you with exceptional heart care, we have created designated Provider Care Teams.  These Care Teams include your primary Cardiologist (physician) and Advanced Practice Providers (APPs -  Physician Assistants and Nurse Practitioners) who all work together to provide you with the care you need, when you need it.  We recommend signing up for the patient portal called "MyChart".  Sign up information is provided on this After Visit Summary.  MyChart is used to connect with patients for Virtual Visits (Telemedicine).  Patients are able to view lab/test results, encounter notes, upcoming appointments, etc.  Non-urgent messages can be sent to your provider as well.   To learn more about what you can do with MyChart, go to [email or web address].    Your next appointment:   1 year(s)  The format for your next appointment:   In Person  Provider:   You may see [name], MD or one of the following Advanced Practice Providers on your designated Care Team:   Tommye Standard, [place] Legrand Como "Jonni Sanger" Chalmers Cater, [place]   Other Instructions   Important Information About Sugar

## 2022-03-01 NOTE — Telephone Encounter (Signed)
Requesting office sent over another clearance request asking for cardiac risk for procedure:  LOW RISK MEDIUM RISK HIGH RISK  I will send back to provider who gave clearance to please address risk stratification. Once this has been the provider can fax notes to requesting office [phone] to Dr. Shana Chute

## 2023-02-27 DIAGNOSIS — C61 Malignant neoplasm of prostate: Secondary | ICD-10-CM | POA: Diagnosis not present

## 2023-02-27 NOTE — Progress Notes (Signed)
  Electrophysiology Office Note:   Date:  02/28/2023  ID:  Christian Ortiz, Christian Ortiz 24-Dec-1950, MRN [id number]  Primary Cardiologist: None Electrophysiologist: Will Gladis Norton, MD      History of Present Illness:   Christian Ortiz is a 73 y.o. male with h/o AF, COPD, HTN, NICM, OSA, and h/o prostate CA seen today for routine electrophysiology followup.   Since last being seen in our clinic the patient reports doing well. Overall,  he denies chest pain, palpitations, dyspnea, PND, orthopnea, nausea, vomiting, dizziness, syncope, edema, weight gain, or early satiety.   Review of systems complete and found to be negative unless listed in HPI.   EP Information / Studies Reviewed:    EKG is ordered today. Personal review as below.  EKG Interpretation Date/Time:  Friday February 28 2023 10:54:46 EST Ventricular Rate:  62 PR Interval:  152 QRS Duration:  84 QT Interval:  434 QTC Calculation: 440 R Axis:   53  Text Interpretation: Normal sinus rhythm Normal ECG When compared with ECG of 09-Dec-2017 10:52, Confirmed by Lesia Sharper [phone]) on 02/28/2023 11:11:18 AM    Arrhythmia History  Diagnosed 2019 - amiodarone  PVI ablation 11/13/2017 Off amiodarone  post ablation, March 2020  Echo 12/2019 LVEF 55-60%, grade 1 DD, mild MR, trivial AI  Physical Exam:   VS:  BP 138/76 (BP Location: Left Arm, Patient Position: Sitting, Cuff Size: Large)   Pulse 62   Ht 6' 1 (1.854 m)   Wt 234 lb 3.2 oz (106.2 kg)   SpO2 96%   BMI 30.90 kg/m    Wt Readings from Last 3 Encounters:  02/28/23 234 lb 3.2 oz (106.2 kg)  02/27/22 242 lb 3.2 oz (109.9 kg)  06/29/21 248 lb 9.6 oz (112.8 kg)     GEN: Well nourished, well developed in no acute distress NECK: No JVD; No carotid bruits CARDIAC: Regular rate and rhythm, no murmurs, rubs, gallops RESPIRATORY:  Clear to auscultation without rales, wheezing or rhonchi  ABDOMEN: Soft, non-tender, non-distended EXTREMITIES:  No edema; No deformity    ASSESSMENT AND PLAN:    Paroxysmal AF EKG today shows NSR No recurrence s/p ablation 2019 Off amiodarone  since ablation Continue Xarelto  20 mg daily for CHA2DS2VASc of at least 4  Stable labs December  HFrecEF NYHA II symptoms Volume status stable EF 55-60% 2021.  HTN Stable on current regimen   Follow up with Dr. Norton in 12 months  Signed, Sharper Prentice Lesia, PA-C

## 2023-02-28 ENCOUNTER — Ambulatory Visit: Payer: 59 | Attending: Student | Admitting: Student

## 2023-02-28 ENCOUNTER — Encounter: Payer: Self-pay | Admitting: Student

## 2023-02-28 VITALS — BP 138/76 | HR 62 | Ht 73.0 in | Wt 234.2 lb

## 2023-02-28 DIAGNOSIS — I10 Essential (primary) hypertension: Secondary | ICD-10-CM

## 2023-02-28 DIAGNOSIS — I480 Paroxysmal atrial fibrillation: Secondary | ICD-10-CM | POA: Diagnosis not present

## 2023-02-28 DIAGNOSIS — I428 Other cardiomyopathies: Secondary | ICD-10-CM

## 2023-02-28 NOTE — Patient Instructions (Signed)
Medication Instructions:  Your physician recommends that you continue on your current medications as directed. Please refer to the Current Medication list given to you today. *If you need a refill on your cardiac medications before your next appointment, please call your pharmacy*  Follow-Up: At [facility], you and your health needs are our priority.  As part of our continuing mission to provide you with exceptional heart care, we have created designated Provider Care Teams.  These Care Teams include your primary Cardiologist (physician) and Advanced Practice Providers (APPs -  Physician Assistants and Nurse Practitioners) who all work together to provide you with the care you need, when you need it.  We recommend signing up for the patient portal called "MyChart".  Sign up information is provided on this After Visit Summary.  MyChart is used to connect with patients for Virtual Visits (Telemedicine).  Patients are able to view lab/test results, encounter notes, upcoming appointments, etc.  Non-urgent messages can be sent to your provider as well.   To learn more about what you can do with MyChart, go to [email or web address].    Your next appointment:   1 year(s)  Provider:   Allegra Lai, MD

## 2023-03-04 DIAGNOSIS — R051 Acute cough: Secondary | ICD-10-CM | POA: Diagnosis not present

## 2023-03-04 DIAGNOSIS — J069 Acute upper respiratory infection, unspecified: Secondary | ICD-10-CM | POA: Diagnosis not present

## 2023-03-04 DIAGNOSIS — Z20822 Contact with and (suspected) exposure to covid-19: Secondary | ICD-10-CM | POA: Diagnosis not present

## 2023-03-04 DIAGNOSIS — R3915 Urgency of urination: Secondary | ICD-10-CM | POA: Diagnosis not present

## 2023-03-10 DIAGNOSIS — C61 Malignant neoplasm of prostate: Secondary | ICD-10-CM | POA: Diagnosis not present

## 2023-03-10 DIAGNOSIS — R3129 Other microscopic hematuria: Secondary | ICD-10-CM | POA: Diagnosis not present

## 2023-03-10 DIAGNOSIS — R3915 Urgency of urination: Secondary | ICD-10-CM | POA: Diagnosis not present

## 2023-03-10 DIAGNOSIS — R972 Elevated prostate specific antigen [PSA]: Secondary | ICD-10-CM | POA: Diagnosis not present

## 2023-03-11 ENCOUNTER — Other Ambulatory Visit: Payer: Self-pay | Admitting: Cardiology

## 2023-03-11 DIAGNOSIS — I482 Chronic atrial fibrillation, unspecified: Secondary | ICD-10-CM

## 2023-03-11 NOTE — Telephone Encounter (Signed)
 Xarelto  20mg  refill request received. Pt is 73 years old, weight-106.2kg, Crea-0.83 on 02/13/23 via care Everywhere [facility], last seen by Jodie Passey on 02/28/23, Diagnosis-Afib, CrCl-124.4 mL/min; Dose is appropriate based on dosing criteria. Will send in refill to requested pharmacy.

## 2023-03-11 NOTE — Telephone Encounter (Signed)
 Please review

## 2023-03-19 DIAGNOSIS — R232 Flushing: Secondary | ICD-10-CM | POA: Diagnosis not present

## 2023-03-19 DIAGNOSIS — C61 Malignant neoplasm of prostate: Secondary | ICD-10-CM | POA: Diagnosis not present

## 2023-09-10 ENCOUNTER — Other Ambulatory Visit: Payer: Self-pay | Admitting: Cardiology

## 2023-09-10 DIAGNOSIS — I482 Chronic atrial fibrillation, unspecified: Secondary | ICD-10-CM

## 2023-09-10 NOTE — Telephone Encounter (Signed)
 Prescription refill request for Xarelto  received.  Indication: PAF Last office visit: 02/28/23  CHRISTELLA Passey PA-C Weight: 893.7xh Age: 73 Scr: 0.88 on 08/18/23  Epic CrCl: 117.33  Based on above findings Xarelto  20mg  daily is the appropriate dose.  Refill approved.

## 2024-03-09 NOTE — Progress Notes (Unsigned)
" °  Electrophysiology Office Note:   Date:  03/10/2024  ID:  Christian Ortiz, DOB 1950-06-20, MRN [id number]  Primary Cardiologist: None Primary Heart Failure: None Electrophysiologist: Nethan Caudillo Gladis Norton, MD      History of Present Illness:   Christian Ortiz is a 74 y.o. male with h/o atrial fibrillation, COPD, hypertension, chronic systolic heart failure due to nonischemic cardiomyopathy, sleep apnea, prostate cancer seen today for routine electrophysiology followup.   He is feeling well today.  He has no chest pain or shortness of breath.  He is able to do all his daily activities without restriction.  He has no acute complaints at this time.  He has noted no further episodes of atrial fibrillation.  he denies chest pain, palpitations, dyspnea, PND, orthopnea, nausea, vomiting, dizziness, syncope, edema, weight gain, or early satiety.   Review of systems complete and found to be negative unless listed in HPI.   EP Information / Studies Reviewed:    EKG is ordered today. Personal review as below.  EKG Interpretation Date/Time:  Wednesday March 10 2024 14:51:45 EST Ventricular Rate:  62 PR Interval:  150 QRS Duration:  82 QT Interval:  436 QTC Calculation: 442 R Axis:   25  Text Interpretation: Normal sinus rhythm Normal ECG When compared with ECG of 28-Feb-2023 10:54, No significant change was found Confirmed by Fani Rotondo ([id number]) on 03/10/2024 3:09:27 PM    Risk Assessment/Calculations:    CHA2DS2-VASc Score = 2   This indicates a 2.2% annual risk of stroke. The patient's score is based upon: CHF History: 0 HTN History: 1 Diabetes History: 0 Stroke History: 0 Vascular Disease History: 0 Age Score: 1 Gender Score: 0            Physical Exam:   VS:  BP 138/80 (BP Location: Left Arm, Patient Position: Sitting, Cuff Size: Large)   Pulse 62   Ht 6' 1 (1.854 m)   Wt 230 lb (104.3 kg)   SpO2 97%   BMI 30.34 kg/m    Wt Readings from Last 3 Encounters:   03/10/24 230 lb (104.3 kg)  02/28/23 234 lb 3.2 oz (106.2 kg)  02/27/22 242 lb 3.2 oz (109.9 kg)     GEN: Well nourished, well developed in no acute distress NECK: No JVD; No carotid bruits CARDIAC: Regular rate and rhythm, no murmurs, rubs, gallops RESPIRATORY:  Clear to auscultation without rales, wheezing or rhonchi  ABDOMEN: Soft, non-tender, non-distended EXTREMITIES:  No edema; No deformity   ASSESSMENT AND PLAN:    1.  Paroxysmal atrial fibrillation: Post ablation in 2019.  Has had no further episodes of atrial fibrillation.  He would like to potentially stop his Xarelto .  We have discussed ILR implant.  Sherleen Pangborn check with his insurance to see if they Teoman Giraud cover ILR implantation for A-fib monitoring.  If so, he may be able to stop his Xarelto .  2.  Secondary hypercoagulable state: On Xarelto   3.  Hypertension: Well-controlled  Follow up with EP Team pending ILR implant   Signed, Casidy Alberta Gladis Norton, MD  "

## 2024-03-10 ENCOUNTER — Encounter: Payer: Self-pay | Admitting: Cardiology

## 2024-03-10 ENCOUNTER — Ambulatory Visit: Attending: Cardiology | Admitting: Cardiology

## 2024-03-10 VITALS — BP 138/80 | HR 62 | Ht 73.0 in | Wt 230.0 lb

## 2024-03-10 DIAGNOSIS — I10 Essential (primary) hypertension: Secondary | ICD-10-CM

## 2024-03-10 DIAGNOSIS — I480 Paroxysmal atrial fibrillation: Secondary | ICD-10-CM | POA: Diagnosis not present

## 2024-03-10 DIAGNOSIS — D6869 Other thrombophilia: Secondary | ICD-10-CM

## 2024-03-10 NOTE — Patient Instructions (Addendum)
 Christian Ortiz

## 2024-03-12 ENCOUNTER — Other Ambulatory Visit: Payer: Self-pay | Admitting: *Deleted

## 2024-03-12 DIAGNOSIS — I482 Chronic atrial fibrillation, unspecified: Secondary | ICD-10-CM

## 2024-03-12 MED ORDER — RIVAROXABAN 20 MG PO TABS: 20.0000 mg | ORAL_TABLET | Freq: Every day | ORAL | 5 refills | Status: AC

## 2024-03-12 NOTE — Telephone Encounter (Signed)
 Xarelto  20mg  refill request received. Pt is 74 years old, weight-104.3kg, Crea-0.82 on 11/04/23 via [facility] from [place], last seen by Dr. Inocencio on 03/10/24, Diagnosis-Afib, CrCl-121.9 mL/min; Dose is appropriate based on dosing criteria. Will send in refill to requested pharmacy.
